# Patient Record
Sex: Female | Born: 1945 | Race: White | Hispanic: No | Smoking: Current every day smoker
Health system: Southern US, Community
[De-identification: ages and names within clinical notes are randomized; demographics above are authoritative.]

## PROBLEM LIST (undated history)

## (undated) DIAGNOSIS — I351 Nonrheumatic aortic (valve) insufficiency: Secondary | ICD-10-CM

## (undated) DIAGNOSIS — M79606 Pain in leg, unspecified: Secondary | ICD-10-CM

## (undated) DIAGNOSIS — F329 Major depressive disorder, single episode, unspecified: Secondary | ICD-10-CM

## (undated) DIAGNOSIS — I779 Disorder of arteries and arterioles, unspecified: Secondary | ICD-10-CM

## (undated) DIAGNOSIS — F909 Attention-deficit hyperactivity disorder, unspecified type: Secondary | ICD-10-CM

## (undated) DIAGNOSIS — J449 Chronic obstructive pulmonary disease, unspecified: Secondary | ICD-10-CM

## (undated) DIAGNOSIS — I739 Peripheral vascular disease, unspecified: Secondary | ICD-10-CM

## (undated) DIAGNOSIS — I340 Nonrheumatic mitral (valve) insufficiency: Secondary | ICD-10-CM

## (undated) DIAGNOSIS — I10 Essential (primary) hypertension: Secondary | ICD-10-CM

## (undated) DIAGNOSIS — E785 Hyperlipidemia, unspecified: Secondary | ICD-10-CM

## (undated) HISTORY — PX: RETINAL TEAR REPAIR CRYOTHERAPY: SHX5304

## (undated) HISTORY — DX: Pain in leg, unspecified: M79.606

## (undated) HISTORY — PX: TUBAL LIGATION: SHX77

## (undated) HISTORY — DX: Hyperlipidemia, unspecified: E78.5

---

## 1998-03-26 ENCOUNTER — Inpatient Hospital Stay (HOSPITAL_COMMUNITY): Admission: EM | Admit: 1998-03-26 | Discharge: 1998-03-28 | Payer: Self-pay | Admitting: Emergency Medicine

## 2000-07-22 ENCOUNTER — Emergency Department (HOSPITAL_COMMUNITY): Admission: EM | Admit: 2000-07-22 | Discharge: 2000-07-23 | Payer: Self-pay | Admitting: Emergency Medicine

## 2000-11-17 ENCOUNTER — Encounter: Admission: RE | Admit: 2000-11-17 | Discharge: 2000-11-17 | Payer: Self-pay | Admitting: Family Medicine

## 2000-11-17 ENCOUNTER — Encounter: Payer: Self-pay | Admitting: Family Medicine

## 2000-12-14 ENCOUNTER — Encounter: Admission: RE | Admit: 2000-12-14 | Discharge: 2001-01-09 | Payer: Self-pay | Admitting: Family Medicine

## 2001-07-18 ENCOUNTER — Encounter: Admission: RE | Admit: 2001-07-18 | Discharge: 2001-07-18 | Payer: Self-pay | Admitting: Family Medicine

## 2001-07-18 ENCOUNTER — Encounter: Payer: Self-pay | Admitting: Family Medicine

## 2002-04-27 ENCOUNTER — Other Ambulatory Visit: Admission: RE | Admit: 2002-04-27 | Discharge: 2002-04-27 | Payer: Self-pay | Admitting: Family Medicine

## 2003-08-22 ENCOUNTER — Other Ambulatory Visit: Admission: RE | Admit: 2003-08-22 | Discharge: 2003-08-22 | Payer: Self-pay | Admitting: Family Medicine

## 2003-12-06 ENCOUNTER — Encounter: Admission: RE | Admit: 2003-12-06 | Discharge: 2003-12-06 | Payer: Self-pay | Admitting: Gastroenterology

## 2003-12-06 ENCOUNTER — Encounter: Admission: RE | Admit: 2003-12-06 | Discharge: 2003-12-06 | Payer: Self-pay | Admitting: Family Medicine

## 2004-04-29 ENCOUNTER — Encounter: Admission: RE | Admit: 2004-04-29 | Discharge: 2004-04-29 | Payer: Self-pay | Admitting: Family Medicine

## 2005-02-25 ENCOUNTER — Ambulatory Visit: Payer: Self-pay | Admitting: Family Medicine

## 2005-03-02 ENCOUNTER — Encounter: Admission: RE | Admit: 2005-03-02 | Discharge: 2005-03-02 | Payer: Self-pay | Admitting: Family Medicine

## 2005-03-02 ENCOUNTER — Ambulatory Visit: Payer: Self-pay | Admitting: Family Medicine

## 2005-03-02 ENCOUNTER — Other Ambulatory Visit: Admission: RE | Admit: 2005-03-02 | Discharge: 2005-03-02 | Payer: Self-pay | Admitting: Family Medicine

## 2005-03-24 ENCOUNTER — Ambulatory Visit: Payer: Self-pay | Admitting: Family Medicine

## 2005-06-18 ENCOUNTER — Ambulatory Visit: Payer: Self-pay | Admitting: Family Medicine

## 2006-04-01 ENCOUNTER — Ambulatory Visit: Payer: Self-pay | Admitting: Family Medicine

## 2006-05-12 ENCOUNTER — Ambulatory Visit: Payer: Self-pay | Admitting: Family Medicine

## 2006-05-23 ENCOUNTER — Other Ambulatory Visit: Admission: RE | Admit: 2006-05-23 | Discharge: 2006-05-23 | Payer: Self-pay | Admitting: Family Medicine

## 2006-05-23 ENCOUNTER — Ambulatory Visit: Payer: Self-pay | Admitting: Family Medicine

## 2006-05-23 ENCOUNTER — Encounter: Payer: Self-pay | Admitting: Family Medicine

## 2006-11-22 ENCOUNTER — Ambulatory Visit: Payer: Self-pay | Admitting: Family Medicine

## 2007-03-10 ENCOUNTER — Ambulatory Visit (HOSPITAL_COMMUNITY): Payer: Self-pay | Admitting: Psychiatry

## 2007-04-20 ENCOUNTER — Ambulatory Visit (HOSPITAL_COMMUNITY): Payer: Self-pay | Admitting: Psychiatry

## 2007-05-18 ENCOUNTER — Ambulatory Visit (HOSPITAL_COMMUNITY): Payer: Self-pay | Admitting: Psychiatry

## 2007-06-06 ENCOUNTER — Ambulatory Visit (HOSPITAL_COMMUNITY): Payer: Self-pay | Admitting: Psychiatry

## 2007-06-12 ENCOUNTER — Ambulatory Visit (HOSPITAL_COMMUNITY): Payer: Self-pay | Admitting: Psychiatry

## 2007-06-13 DIAGNOSIS — F329 Major depressive disorder, single episode, unspecified: Secondary | ICD-10-CM

## 2007-06-13 DIAGNOSIS — K219 Gastro-esophageal reflux disease without esophagitis: Secondary | ICD-10-CM

## 2007-06-21 ENCOUNTER — Ambulatory Visit (HOSPITAL_COMMUNITY): Payer: Self-pay | Admitting: Psychiatry

## 2007-06-28 ENCOUNTER — Ambulatory Visit (HOSPITAL_COMMUNITY): Payer: Self-pay | Admitting: Psychiatry

## 2007-07-03 ENCOUNTER — Ambulatory Visit (HOSPITAL_COMMUNITY): Payer: Self-pay | Admitting: Psychiatry

## 2007-07-11 ENCOUNTER — Ambulatory Visit (HOSPITAL_COMMUNITY): Payer: Self-pay | Admitting: Psychiatry

## 2007-07-18 ENCOUNTER — Ambulatory Visit (HOSPITAL_COMMUNITY): Payer: Self-pay | Admitting: Psychiatry

## 2007-07-25 ENCOUNTER — Ambulatory Visit (HOSPITAL_COMMUNITY): Payer: Self-pay | Admitting: Psychiatry

## 2007-08-01 ENCOUNTER — Ambulatory Visit (HOSPITAL_COMMUNITY): Payer: Self-pay | Admitting: Psychiatry

## 2007-08-09 ENCOUNTER — Ambulatory Visit (HOSPITAL_COMMUNITY): Payer: Self-pay | Admitting: Psychiatry

## 2007-08-22 ENCOUNTER — Ambulatory Visit (HOSPITAL_COMMUNITY): Payer: Self-pay | Admitting: Psychiatry

## 2007-09-01 ENCOUNTER — Ambulatory Visit (HOSPITAL_COMMUNITY): Payer: Self-pay | Admitting: Psychiatry

## 2007-09-05 ENCOUNTER — Ambulatory Visit (HOSPITAL_COMMUNITY): Payer: Self-pay | Admitting: Psychiatry

## 2007-09-19 ENCOUNTER — Ambulatory Visit (HOSPITAL_COMMUNITY): Payer: Self-pay | Admitting: Psychiatry

## 2007-11-10 ENCOUNTER — Ambulatory Visit (HOSPITAL_COMMUNITY): Payer: Self-pay | Admitting: Psychiatry

## 2007-11-21 ENCOUNTER — Ambulatory Visit (HOSPITAL_COMMUNITY): Payer: Self-pay | Admitting: Psychiatry

## 2007-12-12 ENCOUNTER — Ambulatory Visit (HOSPITAL_COMMUNITY): Payer: Self-pay | Admitting: Psychology

## 2007-12-14 ENCOUNTER — Ambulatory Visit (HOSPITAL_COMMUNITY): Payer: Self-pay | Admitting: Psychiatry

## 2007-12-26 ENCOUNTER — Ambulatory Visit (HOSPITAL_COMMUNITY): Payer: Self-pay | Admitting: Psychiatry

## 2008-01-08 ENCOUNTER — Ambulatory Visit (HOSPITAL_COMMUNITY): Payer: Self-pay | Admitting: Psychiatry

## 2008-01-16 ENCOUNTER — Ambulatory Visit (HOSPITAL_COMMUNITY): Payer: Self-pay | Admitting: Psychology

## 2008-02-09 ENCOUNTER — Ambulatory Visit (HOSPITAL_COMMUNITY): Payer: Self-pay | Admitting: Psychiatry

## 2008-02-13 ENCOUNTER — Ambulatory Visit (HOSPITAL_COMMUNITY): Payer: Self-pay | Admitting: Psychology

## 2008-03-12 ENCOUNTER — Ambulatory Visit (HOSPITAL_COMMUNITY): Payer: Self-pay | Admitting: Psychology

## 2008-03-22 ENCOUNTER — Ambulatory Visit (HOSPITAL_COMMUNITY): Payer: Self-pay | Admitting: Psychiatry

## 2008-04-10 ENCOUNTER — Ambulatory Visit (HOSPITAL_COMMUNITY): Payer: Self-pay | Admitting: Psychology

## 2008-04-17 ENCOUNTER — Ambulatory Visit (HOSPITAL_COMMUNITY): Payer: Self-pay | Admitting: Psychiatry

## 2008-05-07 ENCOUNTER — Ambulatory Visit (HOSPITAL_COMMUNITY): Payer: Self-pay | Admitting: Psychology

## 2008-05-27 ENCOUNTER — Ambulatory Visit (HOSPITAL_COMMUNITY): Payer: Self-pay | Admitting: Psychology

## 2008-06-05 ENCOUNTER — Ambulatory Visit (HOSPITAL_COMMUNITY): Payer: Self-pay | Admitting: Psychiatry

## 2008-06-18 ENCOUNTER — Ambulatory Visit (HOSPITAL_COMMUNITY): Payer: Self-pay | Admitting: Psychology

## 2008-07-09 ENCOUNTER — Ambulatory Visit (HOSPITAL_COMMUNITY): Payer: Self-pay | Admitting: Psychology

## 2008-07-17 ENCOUNTER — Ambulatory Visit (HOSPITAL_COMMUNITY): Payer: Self-pay | Admitting: Psychiatry

## 2008-09-18 ENCOUNTER — Ambulatory Visit (HOSPITAL_COMMUNITY): Payer: Self-pay | Admitting: Psychiatry

## 2008-10-18 ENCOUNTER — Ambulatory Visit: Payer: Self-pay | Admitting: Family Medicine

## 2008-10-30 ENCOUNTER — Ambulatory Visit: Payer: Self-pay | Admitting: Family Medicine

## 2008-10-31 ENCOUNTER — Telehealth (INDEPENDENT_AMBULATORY_CARE_PROVIDER_SITE_OTHER): Payer: Self-pay | Admitting: *Deleted

## 2008-12-10 ENCOUNTER — Ambulatory Visit (HOSPITAL_COMMUNITY): Payer: Self-pay | Admitting: Psychiatry

## 2009-02-11 ENCOUNTER — Ambulatory Visit (HOSPITAL_COMMUNITY): Payer: Self-pay | Admitting: Psychiatry

## 2009-04-10 ENCOUNTER — Ambulatory Visit (HOSPITAL_COMMUNITY): Payer: Self-pay | Admitting: Psychiatry

## 2009-06-05 ENCOUNTER — Ambulatory Visit (HOSPITAL_COMMUNITY): Payer: Self-pay | Admitting: Psychiatry

## 2009-09-09 ENCOUNTER — Ambulatory Visit (HOSPITAL_COMMUNITY): Payer: Self-pay | Admitting: Psychiatry

## 2009-11-04 ENCOUNTER — Ambulatory Visit (HOSPITAL_COMMUNITY): Payer: Self-pay | Admitting: Psychiatry

## 2010-01-13 ENCOUNTER — Ambulatory Visit (HOSPITAL_COMMUNITY): Payer: Self-pay | Admitting: Psychiatry

## 2010-01-16 ENCOUNTER — Telehealth: Payer: Self-pay | Admitting: Family Medicine

## 2010-01-19 ENCOUNTER — Ambulatory Visit: Payer: Self-pay | Admitting: Family Medicine

## 2010-01-19 ENCOUNTER — Telehealth: Payer: Self-pay | Admitting: Family Medicine

## 2010-01-19 DIAGNOSIS — M5137 Other intervertebral disc degeneration, lumbosacral region: Secondary | ICD-10-CM

## 2010-01-19 DIAGNOSIS — F17200 Nicotine dependence, unspecified, uncomplicated: Secondary | ICD-10-CM

## 2010-01-21 LAB — CONVERTED CEMR LAB
Alkaline Phosphatase: 92 units/L (ref 39–117)
Basophils Absolute: 0 10*3/uL (ref 0.0–0.1)
Bilirubin, Direct: 0 mg/dL (ref 0.0–0.3)
CO2: 30 meq/L (ref 19–32)
Calcium: 9.1 mg/dL (ref 8.4–10.5)
Chloride: 85 meq/L — ABNORMAL LOW (ref 96–112)
Creatinine, Ser: 0.9 mg/dL (ref 0.4–1.2)
Eosinophils Absolute: 0.3 10*3/uL (ref 0.0–0.7)
Eosinophils Relative: 1.9 % (ref 0.0–5.0)
Folate: 18.1 ng/mL
GFR calc non Af Amer: 67.05 mL/min (ref >60)
Glucose, Bld: 104 mg/dL — ABNORMAL HIGH (ref 70–99)
HCT: 41.8 % (ref 36.0–46.0)
MCV: 86.3 fL (ref 78.0–100.0)
Monocytes Absolute: 0.7 10*3/uL (ref 0.1–1.0)
Neutrophils Relative %: 66.4 % (ref 43.0–77.0)
Platelets: 324 10*3/uL (ref 150.0–400.0)
Potassium: 3.3 meq/L — ABNORMAL LOW (ref 3.5–5.1)
RDW: 13.8 % (ref 11.5–14.6)
Saturation Ratios: 18.8 % — ABNORMAL LOW (ref 20.0–50.0)
Total Bilirubin: 0.2 mg/dL — ABNORMAL LOW (ref 0.3–1.2)
Total CHOL/HDL Ratio: 3
Total Protein: 7.6 g/dL (ref 6.0–8.3)
Transferrin: 278 mg/dL (ref 212.0–360.0)
Vitamin B-12: 497 pg/mL (ref 211–911)

## 2010-01-29 ENCOUNTER — Ambulatory Visit: Payer: Self-pay | Admitting: Family Medicine

## 2010-01-29 LAB — CONVERTED CEMR LAB
CO2: 28 meq/L (ref 19–32)
Calcium: 8.7 mg/dL (ref 8.4–10.5)
Potassium: 4 meq/L (ref 3.5–5.1)
Sodium: 143 meq/L (ref 135–145)

## 2010-02-10 ENCOUNTER — Ambulatory Visit: Payer: Self-pay | Admitting: Family Medicine

## 2010-02-26 ENCOUNTER — Ambulatory Visit: Payer: Self-pay | Admitting: Cardiovascular Disease

## 2010-02-26 DIAGNOSIS — M79609 Pain in unspecified limb: Secondary | ICD-10-CM | POA: Insufficient documentation

## 2010-03-10 ENCOUNTER — Ambulatory Visit: Payer: Self-pay

## 2010-03-10 ENCOUNTER — Ambulatory Visit: Payer: Self-pay | Admitting: Family Medicine

## 2010-03-10 ENCOUNTER — Encounter: Payer: Self-pay | Admitting: Cardiovascular Disease

## 2010-03-11 ENCOUNTER — Encounter: Payer: Self-pay | Admitting: Family Medicine

## 2010-03-17 ENCOUNTER — Telehealth: Payer: Self-pay | Admitting: Family Medicine

## 2010-03-18 ENCOUNTER — Telehealth: Payer: Self-pay | Admitting: Family Medicine

## 2010-03-18 HISTORY — PX: LAMINECTOMY: SHX219

## 2010-03-23 ENCOUNTER — Ambulatory Visit: Payer: Self-pay | Admitting: Cardiovascular Disease

## 2010-03-25 ENCOUNTER — Encounter (INDEPENDENT_AMBULATORY_CARE_PROVIDER_SITE_OTHER): Payer: Self-pay | Admitting: *Deleted

## 2010-03-26 ENCOUNTER — Encounter: Admission: RE | Admit: 2010-03-26 | Discharge: 2010-03-26 | Payer: Self-pay | Admitting: Family Medicine

## 2010-03-30 ENCOUNTER — Telehealth: Payer: Self-pay | Admitting: Family Medicine

## 2010-03-31 ENCOUNTER — Telehealth: Payer: Self-pay | Admitting: Family Medicine

## 2010-04-04 ENCOUNTER — Encounter: Payer: Self-pay | Admitting: Family Medicine

## 2010-04-30 ENCOUNTER — Encounter: Admission: RE | Admit: 2010-04-30 | Discharge: 2010-06-26 | Payer: Self-pay | Admitting: Neurosurgery

## 2010-05-15 ENCOUNTER — Ambulatory Visit (HOSPITAL_COMMUNITY): Payer: Self-pay | Admitting: Psychiatry

## 2010-08-14 ENCOUNTER — Ambulatory Visit (HOSPITAL_COMMUNITY): Payer: Self-pay | Admitting: Psychiatry

## 2010-10-14 ENCOUNTER — Encounter: Payer: Self-pay | Admitting: Emergency Medicine

## 2010-10-14 ENCOUNTER — Ambulatory Visit
Admission: RE | Admit: 2010-10-14 | Discharge: 2010-10-14 | Payer: Self-pay | Source: Home / Self Care | Admitting: Emergency Medicine

## 2010-10-14 DIAGNOSIS — E785 Hyperlipidemia, unspecified: Secondary | ICD-10-CM

## 2010-10-14 DIAGNOSIS — M25519 Pain in unspecified shoulder: Secondary | ICD-10-CM | POA: Insufficient documentation

## 2010-10-15 ENCOUNTER — Ambulatory Visit
Admission: RE | Admit: 2010-10-15 | Discharge: 2010-10-15 | Payer: Self-pay | Source: Home / Self Care | Attending: Family Medicine | Admitting: Family Medicine

## 2010-10-15 ENCOUNTER — Encounter: Payer: Self-pay | Admitting: Family Medicine

## 2010-10-15 LAB — CONVERTED CEMR LAB
ALT: 9 units/L (ref 0–35)
AST: 16 units/L (ref 0–37)
Alkaline Phosphatase: 87 units/L (ref 39–117)
Amylase: 51 units/L (ref 0–105)
Basophils Relative: 0 % (ref 0–1)
Eosinophils Absolute: 0.3 10*3/uL (ref 0.0–0.7)
Eosinophils Relative: 3 % (ref 0–5)
Glucose, Bld: 100 mg/dL — ABNORMAL HIGH (ref 70–99)
Hemoglobin: 12.9 g/dL (ref 12.0–15.0)
Lymphs Abs: 2.6 10*3/uL (ref 0.7–4.0)
MCHC: 33.5 g/dL (ref 30.0–36.0)
MCV: 86.1 fL (ref 78.0–100.0)
Neutrophils Relative %: 66 % (ref 43–77)
Platelets: 247 10*3/uL (ref 150–400)
Potassium: 3.5 meq/L (ref 3.5–5.3)
RBC: 4.47 M/uL (ref 3.87–5.11)
Sodium: 140 meq/L (ref 135–145)
Total Bilirubin: 0.2 mg/dL — ABNORMAL LOW (ref 0.3–1.2)
Total Protein: 6.3 g/dL (ref 6.0–8.3)

## 2010-10-16 ENCOUNTER — Telehealth: Payer: Self-pay | Admitting: Family Medicine

## 2010-10-16 ENCOUNTER — Encounter
Admission: RE | Admit: 2010-10-16 | Discharge: 2010-10-16 | Payer: Self-pay | Source: Home / Self Care | Attending: Family Medicine | Admitting: Family Medicine

## 2010-10-22 ENCOUNTER — Ambulatory Visit (HOSPITAL_COMMUNITY)
Admission: RE | Admit: 2010-10-22 | Discharge: 2010-10-22 | Payer: Self-pay | Source: Home / Self Care | Attending: Psychiatry | Admitting: Psychiatry

## 2010-11-17 NOTE — Progress Notes (Signed)
Summary: neuro appointment  Phone Note Call from Patient Call back at Home Phone [phone]   Summary of Call: patient is calling because her appointment for neuro is not until 04-23-10.  she is worried because her symptoms are increasing.  she would like to know if she can see someone sooner or if Dr Tawanna Cooler could possibly order a MRI?  Initial call taken by: Kern Reap CMA Duncan Dull),  March 18, 2010 12:52 PM  Follow-up for Phone Call        terri please call see if they can see her sooner Follow-up by: Roderick Pee MD,  March 19, 2010 9:00 AM  Additional Follow-up for Phone Call Additional follow up Details #1::        I called and spoke with Diane @ [facility] - she said the doctors do not have any earlier appts available for this pt (no work-ins...per nurse).  She recommended having Dr. Tawanna Cooler call and s/w Dr. Sandria Manly (Dr. Vickey Huger is out of town). Additional Follow-up by: Corky Mull,  March 19, 2010 11:13 AM    Additional Follow-up for Phone Call Additional follow up Details #2::    please call [facility] and have him seen in consultation in the neurology division.  There Follow-up by: Roderick Pee MD,  March 19, 2010 12:21 PM  Additional Follow-up for Phone Call Additional follow up Details #3:: Details for Additional Follow-up Action Taken: I called [facility] : [facility] and they have no appts until second week of July.  They cannot work-in pt, either. Additional Follow-up by: Corky Mull,  March 19, 2010 1:52 PM

## 2010-11-17 NOTE — Assessment & Plan Note (Signed)
Summary: PER CHECK OUT/SF   Visit Type:  Follow-up Primary Provider:  Roderick Pee MD  CC:  no complaints.  History of Present Illness: 65 yo female with history of GERD, Depression, tobacco abuse, lower back pain secondary to DDD here today for PV follow up. I saw her as a new PV consult last month. She told me at the first visit that she has recently began to have redness of her feet during the day and a warm feeling across both feet. Both feet are "sore" by the end of the day. There is no ambulatory calf pain. Her legs feel restless at the end of the day. Her legs feel heavy if she walks long distances. No ulcerations. Non-invasive studies were ordered at the last visit. There was no evidence of lower extremity arterial disease. She continues to have burning across her feet and leg heaviness. No calf cramping or pain.   Current Medications (verified): 1)  Aspirin 81 Mg Tbec (Aspirin) .... Take One Tablet By Mouth Daily 2)  Zoloft 100 Mg Tabs (Sertraline Hcl) .... 2 Tab By Mouth Once Daily 3)  Zantac 150 Maximum Strength 150 Mg Tabs (Ranitidine Hcl) .Marland Kitchen.. 1 Tab By Mouth Once Daily 4)  Prilosec 20 Mg Cpdr (Omeprazole) .Marland Kitchen.. 1 Tab By Mouth Once Daily 5)  Prevident 5000 Dry Mouth 1.1 % Pste (Sodium Fluoride) .... Use Ad Directed 6)  Simvastatin 80 Mg Tabs (Simvastatin) .... Take One Tab At Bedtime 7)  Triamterene-Hctz 37.5-25 Mg Tabs (Triamterene-Hctz) .Marland Kitchen.. 1 Tab M, W, F 8)  Vyvanse 50 Mg Caps (Lisdexamfetamine Dimesylate) .... Take One Tab in The Morning 9)  Lorazepam 0.5 Mg Tabs (Lorazepam) .... As Needed For Anxiety 10)  Ibuprofen 200 Mg Tabs (Ibuprofen) .... As Needed 11)  Fish Oil 1200 Mg Caps (Omega-3 Fatty Acids) .... Two Times A Day 12)  Cvs Vit D 5000 High-Potency 5000 Unit Caps (Cholecalciferol) .... Once Daily 13)  Daily Vitamins  Tabs (Multiple Vitamin) .... Once Daily 14)  Vicodin Es 7.5-750 Mg Tabs (Hydrocodone-Acetaminophen) .... 1/2 At Bedtime As Needed 15)  Chantix  Continuing Month Pak 1 Mg Tabs (Varenicline Tartrate) .... Uad  Allergies: 1)  ! Erythromycin  Past History:  Past Medical History: Leg pain-no evidence of PVD TOBACCO ABUSE (ICD-305.1) GERD (ICD-530.81) DEPRESSION (ICD-311) PREVENTIVE HEALTH CARE (ICD-V70.0) DISC DISEASE, LUMBAR (ICD-722.52) BRONCHITIS, ACUTE (ICD-466.0) URI (ICD-465.9) FAMILY HISTORY DIABETES 1ST DEGREE RELATIVE (ICD-V18.0)    Social History: Reviewed history from 02/26/2010 and no changes required. 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  The patient denies fatigue, malaise, fever, weight gain/loss, vision loss, decreased hearing, hoarseness, chest pain, palpitations, shortness of breath, prolonged cough, wheezing, sleep apnea, coughing up blood, abdominal pain, blood in stool, nausea, vomiting, diarrhea, heartburn, incontinence, blood in urine, muscle weakness, leg swelling, rash, skin lesions, headache, fainting, dizziness, depression, anxiety, enlarged lymph nodes, easy bruising or bleeding, and environmental allergies.    Vital Signs:  Patient profile:   66 year old female Height:      63.5 inches Weight:      132 pounds Pulse rate:   72 / minute Pulse rhythm:   regular BP sitting:   130 / 90  (left arm)  Vitals Entered By: Jacquelin Hawking, CMA (March 23, 2010 9:55 AM)  Physical Exam  General:  General: Well developed, well nourished, NAD Neuro: No focal deficits Musculoskeletal: Muscle strength 5/5 all ext Psychiatric: Mood and affect normal Lungs:Clear bilaterally, no wheezes, rhonci, crackles CV: RRR no murmurs, gallops rubs Abdomen: soft, NT, ND, BS present Extremities: No edema, pulses 2+.    Arterial Doppler  Procedure date:  03/10/2010  Findings:      Normal arterial flow both legs. ABI  1.0 bilaterally.   Impression & Recommendations:  Problem # 1:  LIMB PAIN (ICD-729.5) No evidence of PVD. Her foot pain is most likely neurological. She has spinal stenosis. She is being referred to neurology by Dr. Tawanna Cooler. No further PV workup.   Problem # 2:  TOBACCO ABUSE (ICD-305.1) She is on Chantix. She is trying to stop. Counseling provided.   Patient Instructions: 1)  Your physician recommends that you schedule a follow-up appointment as needed

## 2010-11-17 NOTE — Progress Notes (Signed)
Summary: REQ FOR APPT?  Phone Note Call from Patient   Caller: Patient  [phone]    Reason for Call: Talk to Doctor Summary of Call: Pt called to adv that she switched to a physician closer to her home but wants to return to Dr Tawanna Cooler..... Pt has been gone from practice for over 3 yrs and by policy would be considered a new pt..... Pt is exp some problems with anxiety and would like to be seen asap... Can you advise an okay to schedule pt for M30 appt asap (by schedule next M30 appt is April 21st but pt needs to be seen before then)??  Initial call taken by: Debbra Riding,  January 16, 2010 10:14 AM  Follow-up for Phone Call        30 minute appointment next week Follow-up by: Roderick Pee MD,  January 16, 2010 1:48 PM  Additional Follow-up for Phone Call Additional follow up Details #1::        Phone Call Completed----Pt was contacted and appt was scheduled for Monday, 12/19/2009 at 12:15pm.  Additional Follow-up by: Debbra Riding,  January 16, 2010 2:22 PM     Appended Document: REQ FOR APPT? Correction------- Appt was scheduled for pt on Monday, 01/19/2010 at 12:15pm.

## 2010-11-17 NOTE — Letter (Signed)
Summary: Previsit letter  [facility]  [address] [place], [place] [address]   Phone: [phone]  Fax: [phone]       03/25/2010 MRN: [id number]  [facility] [address] [place], [place]  [address]  Dear Ms. Kloeppel,  Welcome to the Gastroenterology Division at [facility].    You are scheduled to see a nurse for your pre-procedure visit on 04/06/2010 at 10:30AM on the 3rd floor at [facility], [address].  We ask that you try to arrive at our office 15 minutes prior to your appointment time to allow for check-in.  Your nurse visit will consist of discussing your medical and surgical history, your immediate family medical history, and your medications.    Please bring a complete list of all your medications or, if you prefer, bring the medication bottles and we will list them.  We will need to be aware of both prescribed and over the counter drugs.  We will need to know exact dosage information as well.  If you are on blood thinners (Coumadin, Plavix, Aggrenox, Ticlid, etc.) please call our office today/prior to your appointment, as we need to consult with your physician about holding your medication.   Please be prepared to read and sign documents such as consent forms, a financial agreement, and acknowledgement forms.  If necessary, and with your consent, a friend or relative is welcome to sit-in on the nurse visit with you.  Please bring your insurance card so that we may make a copy of it.  If your insurance requires a referral to see a specialist, please bring your referral form from your primary care physician.  No co-pay is required for this nurse visit.     If you cannot keep your appointment, please call [phone] to cancel or reschedule prior to your appointment date.  This allows [place] the opportunity to schedule an appointment for another patient in need of care.    Thank you for choosing [facility] for your  medical needs.  We appreciate the opportunity to care for you.  Please visit [place] at our website  to learn more about our practice.                     Sincerely.                                                                                                                   The Gastroenterology Division

## 2010-11-17 NOTE — Assessment & Plan Note (Signed)
Summary: NPV/ PVD with Claudication/jml   Visit Type:  new pt visit Primary Provider:  Roderick Pee MD  CC:  Bilateral foot pain and leg heaviness with walking.  History of Present Illness: 64 yo female with history of GERD, Depression, tobacco abuse, lower back pain secondary to DDD here today as a new PV patient. She tells me that she has recently began to have redness of her feet during the day and a warm feeling across both feet. Both feet are "sore" by the end of the day. There is no ambulatory calf pain. Her legs feel restless at the end of the day. Her legs feel heavy if she walks long distances. No ulcerations.   Current Medications (verified): 1)  Aspirin 81 Mg Tbec (Aspirin) .... Take One Tablet By Mouth Daily 2)  Zoloft 100 Mg Tabs (Sertraline Hcl) .... 2 Tab By Mouth Once Daily 3)  Zantac 150 Maximum Strength 150 Mg Tabs (Ranitidine Hcl) .Marland Kitchen.. 1 Tab By Mouth Once Daily 4)  Prilosec 20 Mg Cpdr (Omeprazole) .Marland Kitchen.. 1 Tab By Mouth Once Daily 5)  Prevident 5000 Dry Mouth 1.1 % Pste (Sodium Fluoride) .... Use Ad Directed 6)  Simvastatin 80 Mg Tabs (Simvastatin) .... Take One Tab At Bedtime 7)  Triamterene-Hctz 37.5-25 Mg Tabs (Triamterene-Hctz) .Marland Kitchen.. 1 Tab M, W, F 8)  Vyvanse 50 Mg Caps (Lisdexamfetamine Dimesylate) .... Take One Tab in The Morning 9)  Lorazepam 0.5 Mg Tabs (Lorazepam) .... As Needed For Anxiety 10)  Ibuprofen 200 Mg Tabs (Ibuprofen) .... As Needed 11)  Fish Oil 1200 Mg Caps (Omega-3 Fatty Acids) .... Two Times A Day 12)  Vitamin C 1000 Mg Tabs (Ascorbic Acid) .... Once Daily 13)  Cvs Vit D 5000 High-Potency 5000 Unit Caps (Cholecalciferol) .... Once Daily 14)  B-50  Tabs (Vitamins-Lipotropics) .... Once Daily 15)  Magnesium 250 Mg Tabs (Magnesium) .... Once Daily 16)  Daily Vitamins  Tabs (Multiple Vitamin) .... Once Daily 17)  Vicodin Es 7.5-750 Mg Tabs (Hydrocodone-Acetaminophen) .... 1/2 At Bedtime As Needed 18)  Chantix Continuing Month Pak 1 Mg Tabs  (Varenicline Tartrate) .... Uad  Allergies: 1)  ! Erythromycin  Past History:  Past Medical History: Reviewed history from 02/20/2010 and no changes required. 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) TOBACCO ABUSE (ICD-305.1) GERD (ICD-530.81) DEPRESSION (ICD-311) PREVENTIVE HEALTH CARE (ICD-V70.0) DISC DISEASE, LUMBAR (ICD-722.52) BRONCHITIS, ACUTE (ICD-466.0) URI (ICD-465.9) FAMILY HISTORY DIABETES 1ST DEGREE RELATIVE (ICD-V18.0)    Past Surgical History: Childbirth  x2 BTL Retina Tear  Family History: Family History of Parkinsons Family History Diabetes 1st degree relative Family History Hypertension Family History of Cardiovascular disorder  Mother-deceased, age 93 DM, CAD/CABG (maternal side with strong history of CAD) Father-deceased, Parkinsons, dementia 1 sister-alive, CAD  Social History: Occupation: A/R Systems developer, 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  The patient denies fatigue, malaise, fever, weight gain/loss, vision loss, decreased hearing, hoarseness, chest pain, palpitations, shortness of breath, prolonged cough, wheezing, sleep apnea, coughing up blood, abdominal pain, blood in stool, nausea, vomiting, diarrhea, heartburn, incontinence, blood in urine, muscle weakness, leg swelling, rash, skin lesions, headache, fainting, dizziness, depression, anxiety, enlarged lymph nodes, easy bruising or bleeding, and environmental allergies.         See HPI. Bilateral leg heaviness with ambulation, bilateral foot pain.  Vital Signs:  Patient profile:  65 year old female Height:      63.5 inches Weight:      130 pounds BMI:     22.75 Pulse rate:   78 / minute Pulse rhythm:   regular BP sitting:   140 / 80  (left arm) Cuff size:   large  Vitals Entered By: Danielle Rankin,  CMA (Feb 26, 2010 11:20 AM)  Physical Exam  General:  General: Well developed, well nourished, NAD HEENT: OP clear, mucus membranes moist SKIN: warm, dry Neuro: No focal deficits Musculoskeletal: Muscle strength 5/5 all ext Psychiatric: Mood and affect normal Neck: No JVD, no carotid bruits, no thyromegaly, no lymphadenopathy. Lungs:Clear bilaterally, no wheezes, rhonci, crackles CV: RRR no murmurs, gallops rubs Abdomen: soft, NT, ND, BS present Extremities: No edema, pulses 2+. bilateral PT, 1+ bilateral DP.     EKG  Procedure date:  02/26/2010  Findings:      NSR, rate 78 bpm.   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 Good pulses on examination. Will order lower extremity arterial dopplers to exclude PAD in this pt with h/o tobacco abuse.   Problem # 2:  TOBACCO ABUSE (ICD-305.1) Smoking cessation counseling provided. Pt wishes to stop smoking and is trying to stop.   Other Orders: Arterial Duplex Lower Extremity (Arterial Duplex Low)  Patient Instructions: 1)  Your physician recommends that you schedule a follow-up appointment in: 2-3 weeks 2)  Your physician has requested that you have a lower or upper extremity arterial duplex.  This test is an ultrasound of the arteries in the legs or arms.  It looks at arterial blood flow in the legs and arms.  Allow one hour for Lower and Upper Arterial scans. There are no restrictions or special instructions.

## 2010-11-17 NOTE — Progress Notes (Signed)
Summary: daughter's concerns  Phone Note Call from Patient   Caller: Daughter Call For: Roderick Pee MD Summary of Call: patient's daughter called concerned with the results of her mother's MRI and the response from the NS.  She would like to speak with dr Lewis Grivas her cell number is [phone] or work [phone].  She also stated that her mom is having some incontinence.  Follow-up for Phone Call        I explained to Ms Alicia Aguilar that Dr Tawanna Cooler is with  patients.  I also called the patient and she has agreed to a second opinion.  referral sent.  Follow-up by: Kern Reap CMA Duncan Dull),  March 31, 2010 9:04 AM    s

## 2010-11-17 NOTE — Progress Notes (Signed)
Summary: 2nd opinion  Phone Note Call from Patient   Caller: Patient Call For: Roderick Pee MD Summary of Call: Asking to speak to Fleet Contras about a second opinion regarding with a different neurosurgeon.  Her daughter is recommending this. [phone] Initial call taken by: Lynann Beaver CMA,  March 30, 2010 4:16 PM  Follow-up for Phone Call        Fleet Contras.......... please call Alicia Aguilar and have her call the neurosurgeon, who she's been seeing and have them set up a second opinion Follow-up by: Roderick Pee MD,  March 30, 2010 6:18 PM  Additional Follow-up for Phone Call Additional follow up Details #1::        patient is aware Additional Follow-up by: Kern Reap CMA Duncan Dull),  March 31, 2010 8:51 AM

## 2010-11-17 NOTE — Progress Notes (Signed)
Summary: colon  Phone Note Call from Patient   Summary of Call: Is scheduled for colon at [facility] Fri that she does not want to keep.  She will cancel it and talk to you at [facility] about getting colon setup with [facility].   Initial call taken by: Rudy Jew, RN,  January 19, 2010 3:46 PM  Follow-up for Phone Call        Provider Notified Follow-up by: Roderick Pee MD,  January 19, 2010 4:15 PM

## 2010-11-17 NOTE — Progress Notes (Signed)
Summary: referral  Phone Note Call from Patient Call back at Home Phone [phone]   Summary of Call: Was to have appointment made for my spinal stenosis, foot, numb toe left foot.  Initial call taken by: Rudy Jew, RN,  Mar 17, 2010 3:52 PM  Follow-up for Phone Call        please check on  referral to the Dr. Danielle Dess evaluation of probable spinal stenosis ??????????? Follow-up by: Roderick Pee MD,  March 18, 2010 7:58 AM  Additional Follow-up for Phone Call Additional follow up Details #1::        Appt Scheduled, Left message for pt. Additional Follow-up by: Corky Mull,  March 18, 2010 11:56 AM

## 2010-11-17 NOTE — Assessment & Plan Note (Signed)
Summary: PT RE-EST // RS   Vital Signs:  Patient profile:   65 year old female Height:      63.5 inches Weight:      131 pounds BMI:     22.92 Temp:     99.0 degrees F oral BP sitting:   140 / 88  (left arm) Cuff size:   regular  Vitals Entered By: Kern Reap CMA Duncan Dull) (January 19, 2010 12:31 PM) CC: re-establish care, back pain Is Patient Diabetic? No   CC:  re-establish care and back pain.  History of Present Illness: Alicia Aguilar is a 65 year old female, smoker, who returns as a new patient.  She's had a history of low back pain x 2 months with no history of trauma.  She describes it as right and left lumbar back pain.  The pain is constant and dull on a scale of one to 10 and 6.  It does not radiate.  It is decreased by ice in increased by bending.  She said back pain in the past, but never this prolonged nor severe.  She denies any neurologic problems.  No GI, nor GU symptoms.  She continues to smoke a pack of cigarettes a day.  When we placed her on the chantix program years ago.  It helped will restart it  she also goes on about multiple other problems, including numbness in her feet and redness in her feet, constipation, bloating, etc., etc., etc.  Advised her to set up a 30 minute appointment sometime in the next two to 3 weeks to address the other issues  Allergies: 1)  ! Erythromycin PMH-FH-SH reviewed-no changes except otherwise noted  Social History: Reviewed history from 06/13/2007 and no changes required. Occupation: A/R Systems developer Divorced Current Smoker Alcohol use-yes Regular exercise-no  Review of Systems      See HPI  Physical Exam  General:  Well-developed,well-nourished,in no acute distress; alert,appropriate and cooperative throughout examination Msk:  No deformity or scoliosis noted of thoracic or lumbar spine.   Pulses:  pulses are palpable to about a 4 Extremities:  No clubbing, cyanosis, edema, or deformity noted with normal full range of motion  of all joints.   Neurologic:  No cranial nerve deficits noted. Station and gait are normal. Plantar reflexes are down-going bilaterally. DTRs are symmetrical throughout. Sensory, motor and coordinative functions appear intact.   Impression & Recommendations:  Problem # 1:  TOBACCO ABUSE (ICD-305.1) Assessment New  Orders: Venipuncture (09811) TLB-Lipid Panel (80061-LIPID) TLB-BMP (Basic Metabolic Panel-BMET) (80048-METABOL) TLB-CBC Platelet - w/Differential (85025-CBCD) TLB-Hepatic/Liver Function Pnl (80076-HEPATIC) TLB-TSH (Thyroid Stimulating Hormone) (84443-TSH) TLB-B12 + Folate Pnl (91478_29562-Z30/QMV) TLB-IBC Pnl (Iron/FE;Transferrin) (83550-IBC) Tobacco use cessation intermediate 3-10 minutes (78469)  Her updated medication list for this problem includes:    Chantix Continuing Month Pak 1 Mg Tabs (Varenicline tartrate) ..... Uad  Problem # 2:  DISC DISEASE, LUMBAR (ICD-722.52) Assessment: New  Orders: Venipuncture (62952) TLB-Lipid Panel (80061-LIPID) TLB-BMP (Basic Metabolic Panel-BMET) (80048-METABOL) TLB-CBC Platelet - w/Differential (85025-CBCD) TLB-Hepatic/Liver Function Pnl (80076-HEPATIC) TLB-TSH (Thyroid Stimulating Hormone) (84443-TSH) TLB-B12 + Folate Pnl (84132_44010-U72/ZDG) TLB-IBC Pnl (Iron/FE;Transferrin) (83550-IBC) Physical Therapy Referral (PT) T-Lumbar Spine w/Flex & Ext 4 Views (64403KV) Tobacco use cessation intermediate 3-10 minutes (42595)  Complete Medication List: 1)  Adult Aspirin Low Strength 81 Mg Tbdp (Aspirin) 2)  Zoloft 100 Mg Tabs (Sertraline hcl) .... 2 tab by mouth once daily 3)  Zantac 150 Maximum Strength 150 Mg Tabs (Ranitidine hcl) .Marland Kitchen.. 1 tab by mouth once daily 4)  Prilosec 20  Mg Cpdr (Omeprazole) .Marland Kitchen.. 1 tab by mouth once daily 5)  Prevident 5000 Dry Mouth 1.1 % Pste (Sodium fluoride) .... Use ad directed 6)  Simvastatin 80 Mg Tabs (Simvastatin) .... Take one tab at bedtime 7)  Triamterene-hctz 37.5-25 Mg Tabs  (Triamterene-hctz) .... Take one tab once daily 8)  Vyvanse 50 Mg Caps (Lisdexamfetamine dimesylate) .... Take one tab in the morning 9)  Lorazepam 0.5 Mg Tabs (Lorazepam) .... As needed for anxiety 10)  Ibuprofen 200 Mg Tabs (Ibuprofen) .... As needed 11)  Fish Oil 1200 Mg Caps (Omega-3 fatty acids) .... Two times a day 12)  Vitamin C 1000 Mg Tabs (Ascorbic acid) .... Once daily 13)  Cvs Vit D 5000 High-potency 5000 Unit Caps (Cholecalciferol) .... Once daily 14)  B-50 Tabs (Vitamins-lipotropics) .... Once daily 15)  Magnesium 250 Mg Tabs (Magnesium) .... Once daily 16)  Daily Vitamins Tabs (Multiple vitamin) .... Once daily 17)  Vicodin Es 7.5-750 Mg Tabs (Hydrocodone-acetaminophen) .... 1/2 at bedtime as needed 18)  Chantix Continuing Month Pak 1 Mg Tabs (Varenicline tartrate) .... Uad  Patient Instructions: 1)  go to the main office for your lumbar spine films. 2)  Begin Motrin 600 mg twice a day with food, and we will get to set up for physical therapy ASAP 3)  You can take a half of a Vicodin nightly as needed for severe nighttime pain. 4)  Set time in the next 3 to 6 weeks for a complete physical exam. 5)  Begin the chantix program one half of the blue tablets daily x 1 week then one half tablet twice a day.  After one week on the chantix stop smoking completely Prescriptions: CHANTIX CONTINUING MONTH PAK 1 MG TABS (VARENICLINE TARTRATE) UAD  #1 x 2   Entered and Authorized by:   Roderick Pee MD   Signed by:   Roderick Pee MD on 01/19/2010   Method used:   Print then Give to Patient   RxID:   [id number] VICODIN ES 7.5-750 MG TABS (HYDROCODONE-ACETAMINOPHEN) 1/2 at bedtime as needed  #30 x 1   Entered and Authorized by:   Roderick Pee MD   Signed by:   Roderick Pee MD on 01/19/2010   Method used:   Print then Give to Patient   RxID:   [id number]

## 2010-11-17 NOTE — Assessment & Plan Note (Signed)
Summary: 1 month rov/njr   Vital Signs:  Patient profile:   65 year old female Weight:      133 pounds Temp:     99.4 degrees F oral BP sitting:   130 / 84  (left arm) Cuff size:   regular  Vitals Entered By: Kern Reap CMA Duncan Dull) (Mar 10, 2010 12:09 PM) CC: follow-up visit   Primary Care Provider:  Roderick Pee MD  CC:  follow-up visit.  History of Present Illness: Alicia Aguilar is a 65 year old female, who comes in today for reevaluation of tobacco abuse, and leg pain.  She is done very, very well with the chantix program.  She now only smokes two to 3 cigarettes per day.  Goal by this Friday......... is to stop completely.  she's been to 10, therapy sessions for her back and her back feels much better.  She continues to have pain in her legs with walking.  Her ABI is one in the impression from her arteriogram for is no evidence of significant arterial disease.  Since her lower extremity blood flow is normal and she still has discomfort in her legs when she walks the next most likely diagnosis would be spinal stenosis.  Therefore, we will get her set up for a neurologic consultation  Allergies: 1)  ! Erythromycin  Review of Systems      See HPI  Physical Exam  General:  Well-developed,well-nourished,in no acute distress; alert,appropriate and cooperative throughout examination Pulses:  femorals 2+ bilaterally popliteal dorsalis pedis, posterior tibials 1+   Impression & Recommendations:  Problem # 1:  LIMB PAIN (ICD-729.5) Assessment Unchanged  Orders: Neurology Referral (Neuro) Tobacco use cessation intermediate 3-10 minutes (82956)  Problem # 2:  TOBACCO ABUSE (ICD-305.1) Assessment: Improved  Her updated medication list for this problem includes:    Chantix Continuing Month Pak 1 Mg Tabs (Varenicline tartrate) ..... Uad  Orders: Tobacco use cessation intermediate 3-10 minutes (99406)  Complete Medication List: 1)  Aspirin 81 Mg Tbec (Aspirin) .... Take  one tablet by mouth daily 2)  Zoloft 100 Mg Tabs (Sertraline hcl) .... 2 tab by mouth once daily 3)  Zantac 150 Maximum Strength 150 Mg Tabs (Ranitidine hcl) .Marland Kitchen.. 1 tab by mouth once daily 4)  Prilosec 20 Mg Cpdr (Omeprazole) .Marland Kitchen.. 1 tab by mouth once daily 5)  Prevident 5000 Dry Mouth 1.1 % Pste (Sodium fluoride) .... Use ad directed 6)  Simvastatin 80 Mg Tabs (Simvastatin) .... Take one tab at bedtime 7)  Triamterene-hctz 37.5-25 Mg Tabs (Triamterene-hctz) .Marland Kitchen.. 1 tab m, w, f 8)  Vyvanse 50 Mg Caps (Lisdexamfetamine dimesylate) .... Take one tab in the morning 9)  Lorazepam 0.5 Mg Tabs (Lorazepam) .... As needed for anxiety 10)  Ibuprofen 200 Mg Tabs (Ibuprofen) .... As needed 11)  Fish Oil 1200 Mg Caps (Omega-3 fatty acids) .... Two times a day 12)  Vitamin C 1000 Mg Tabs (Ascorbic acid) .... Once daily 13)  Cvs Vit D 5000 High-potency 5000 Unit Caps (Cholecalciferol) .... Once daily 14)  B-50 Tabs (Vitamins-lipotropics) .... Once daily 15)  Magnesium 250 Mg Tabs (Magnesium) .... Once daily 16)  Daily Vitamins Tabs (Multiple vitamin) .... Once daily 17)  Vicodin Es 7.5-750 Mg Tabs (Hydrocodone-acetaminophen) .... 1/2 at bedtime as needed 18)  Chantix Continuing Month Pak 1 Mg Tabs (Varenicline tartrate) .... Uad  Patient Instructions: 1)  continue to taper down on the cigarettes as your currently doing.  Continue the Entex twice daily. 2)  We will get u  set up for a neurologic consult for further evaluation

## 2010-11-17 NOTE — Assessment & Plan Note (Signed)
Summary: 3 week fup//ccm   Vital Signs:  Patient profile:   65 year old female Height:      63.5 inches Weight:      131 pounds Temp:     98.8 degrees F oral BP sitting:   110 / 80  (left arm) Cuff size:   regular  Vitals Entered By: Kern Reap CMA Duncan Dull) (February 10, 2010 11:16 AM) CC: follow-up visit Is Patient Diabetic? No   CC:  follow-up visit.  History of Present Illness: Alicia Aguilar is a 65 year old female, smoker, but she is on a smoking cessation program with chantix has decreased her cigarette consumption to one or two per day and comes in today for general physical examination  She takes Zoloft 100 mg nightly for depression.  Doing well.  She has reflux esophagitis, for which he takes OTC Prilosec in the morning and one Zantac prior to her evening meal.  She takes simvastatin 80 mg nightly for hyperlipidemia.  She takes Maxzide one daily for fluid retention.  She takes vyvanse 50 mg daily for ADD.  She takes lorazepam .5 mg p.r.n. for anxiety.  She is on a smoking cessation program.  As noted above.  She does not get routine eye exams, nor annual mammography.  Recommended above.  She is in the process of having her teeth extracted.  She does do BSE monthly.  She's never had a colonoscopy.  Tetanus booster 1998 Pneumovax 2006 seasonal flu 2010.  She had a Pap smear in January in [place].  She's having more difficulty with her legs with walking.  Her feet are very red  Allergies: 1)  ! Erythromycin  Past History:  Past medical, surgical, family and social histories (including risk factors) reviewed, and no changes noted (except as noted below).  Past Medical History: Reviewed history from 06/13/2007 and no changes required. TAB Abuse PMS PE ASYM Hematuria Depression GERD  Past Surgical History: Reviewed history from 06/13/2007 and no changes required. CB x2 BTL Retina Tear  Family History: Reviewed history from 06/13/2007 and no changes  required. Family History of Parkinsons Family History Diabetes 1st degree relative Family History Hypertension Family History of Cardiovascular disorder  Social History: Reviewed history from 06/13/2007 and no changes required. Occupation: A/R Systems developer Divorced Current Smoker Alcohol use-yes Regular exercise-no  Review of Systems      See HPI  Physical Exam  General:  Well-developed,well-nourished,in no acute distress; alert,appropriate and cooperative throughout examination Head:  Normocephalic and atraumatic without obvious abnormalities. No apparent alopecia or balding. Eyes:  No corneal or conjunctival inflammation noted. EOMI. Perrla. Funduscopic exam benign, without hemorrhages, exudates or papilledema. Vision grossly normal. Ears:  External ear exam shows no significant lesions or deformities.  Otoscopic examination reveals clear canals, tympanic membranes are intact bilaterally without bulging, retraction, inflammation or discharge. Hearing is grossly normal bilaterally. Nose:  External nasal examination shows no deformity or inflammation. Nasal mucosa are pink and moist without lesions or exudates. Mouth:  Oral mucosa and oropharynx without lesions or exudates.  Teeth in good repair. Neck:  No deformities, masses, or tenderness noted. Chest Wall:  No deformities, masses, or tenderness noted. Breasts:  No mass, nodules, thickening, tenderness, bulging, retraction, inflamation, nipple discharge or skin changes noted.   Lungs:  symmetrical but decreased breath sounds consistent with chronic tobacco abuse Heart:  Normal rate and regular rhythm. S1 and S2 normal without gallop, murmur, click, rub or other extra sounds. Abdomen:  Bowel sounds positive,abdomen soft and non-tender without masses, organomegaly  or hernias noted. Msk:  No deformity or scoliosis noted of thoracic or lumbar spine.   Pulses:  no carotid bruits.  Femoral pulses 3+ popliteals 1+ dorsalis pedis, posterior  tubes, right and left, not palpable Extremities:  the feet, already, and red colored.  No ulcerations.  Poor capillary filling Neurologic:  No cranial nerve deficits noted. Station and gait are normal. Plantar reflexes are down-going bilaterally. DTRs are symmetrical throughout. Sensory, motor and coordinative functions appear intact. Skin:  Intact without suspicious lesions or rashes Cervical Nodes:  No lymphadenopathy noted Axillary Nodes:  No palpable lymphadenopathy Inguinal Nodes:  No significant adenopathy Psych:  Cognition and judgment appear intact. Alert and cooperative with normal attention span and concentration. No apparent delusions, illusions, hallucinations   Impression & Recommendations:  Problem # 1:  TOBACCO ABUSE (ICD-305.1) Assessment Improved  Her updated medication list for this problem includes:    Chantix Continuing Month Pak 1 Mg Tabs (Varenicline tartrate) ..... Uad  Orders: Tobacco use cessation intermediate 3-10 minutes (99406) EKG w/ Interpretation (93000) T-2 View CXR (71020TC)  Problem # 2:  FAMILY HISTORY DIABETES 1ST DEGREE RELATIVE (ICD-V18.0) Assessment: Unchanged  Problem # 3:  DEPRESSION (ICD-311) Assessment: Improved  Her updated medication list for this problem includes:    Zoloft 100 Mg Tabs (Sertraline hcl) .Marland Kitchen... 2 tab by mouth once daily    Lorazepam 0.5 Mg Tabs (Lorazepam) .Marland Kitchen... 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 2 tab by mouth once daily 3)  Zantac 150 Maximum Strength 150 Mg Tabs (Ranitidine hcl) .Marland Kitchen.. 1 tab by mouth once daily 4)   Prilosec 20 Mg Cpdr (Omeprazole) .Marland Kitchen.. 1 tab by mouth once daily 5)  Prevident 5000 Dry Mouth 1.1 % Pste (Sodium fluoride) .... Use ad directed 6)  Simvastatin 80 Mg Tabs (Simvastatin) .... Take one tab at bedtime 7)  Triamterene-hctz 37.5-25 Mg Tabs (Triamterene-hctz) .... Take one tab once daily 8)  Vyvanse 50 Mg Caps (Lisdexamfetamine dimesylate) .... Take one tab in the morning 9)  Lorazepam 0.5 Mg Tabs (Lorazepam) .... As needed for anxiety 10)  Ibuprofen 200 Mg Tabs (Ibuprofen) .... As needed 11)  Fish Oil 1200 Mg Caps (Omega-3 fatty acids) .... Two times a day 12)  Vitamin C 1000 Mg Tabs (Ascorbic acid) .... Once daily 13)  Cvs Vit D 5000 High-potency 5000 Unit Caps (Cholecalciferol) .... Once daily 14)  B-50 Tabs (Vitamins-lipotropics) .... Once daily 15)  Magnesium 250 Mg Tabs (Magnesium) .... Once daily 16)  Daily Vitamins Tabs (Multiple vitamin) .... Once daily 17)  Vicodin Es 7.5-750 Mg Tabs (Hydrocodone-acetaminophen) .... 1/2 at bedtime as needed 18)  Chantix Continuing Month Pak 1 Mg Tabs (Varenicline tartrate) .... Uad  Other Orders: Tdap => 52yrs IM (30160) Admin 1st Vaccine (10932) Gastroenterology Referral (GI)  Patient Instructions: 1)  continue your current medications. 2)  Chest x-ray at the main office. 3)  We will such up the evaluation for your circulation of your legs. 4)  Schedule a colonoscopy/sigmoidoscopy to help detect colon cancer. 5)  Take an Aspirin every day. 6)  Schedule your mammogram. 7)  Please schedule a follow-up appointment in 1 month.   Immunizations Administered:  Tetanus Vaccine:    Vaccine Type: Tdap    Site: right deltoid    Mfr: GlaxoSmithKline    Dose: 0.5 ml    Route: IM    Given by: Kern Reap CMA (AAMA)    Exp. Date: 01/10/2012    Lot #: [id number]    Physician counseled: yes

## 2010-11-17 NOTE — Miscellaneous (Signed)
Summary: Physical Therapy Discharge [facility]  Physical Therapy Discharge Summary/SMOC   Imported By: Maryln Gottron 03/17/2010 10:09:43  _____________________________________________________________________  External Attachment:    Type:   Image     Comment:   External Document

## 2010-11-19 NOTE — Assessment & Plan Note (Signed)
Summary: PAIN IN SHOULDER BLADE/NH   Vital Signs:  Patient Profile:   65 Years Old Female CC:      nausea and right shoulder blade pain x this AM Height:     63.5 inches Weight:      130 pounds O2 Sat:      98 % O2 treatment:    Room Air Temp:     98.2 degrees F oral Pulse rate:   84 / minute Resp:     16 per minute BP sitting:   167 / 103  (left arm) Cuff size:   regular  Vitals Entered By: Lajean Saver RN (October 14, 2010 4:02 PM)                  Updated Prior Medication List: ASPIRIN 81 MG TBEC (ASPIRIN) Take one tablet by mouth daily ZOLOFT 100 MG TABS (SERTRALINE HCL) 2 tab by mouth once daily ZANTAC 150 MAXIMUM STRENGTH 150 MG TABS (RANITIDINE HCL) 1 tab by mouth once daily PRILOSEC 20 MG CPDR (OMEPRAZOLE) 1 tab by mouth once daily PREVIDENT 5000 DRY MOUTH 1.1 % PSTE (SODIUM FLUORIDE) use ad directed SIMVASTATIN 80 MG TABS (SIMVASTATIN) take one tab at bedtime TRIAMTERENE-HCTZ 37.5-25 MG TABS (TRIAMTERENE-HCTZ) 1 tab m, w, f VYVANSE 50 MG CAPS (LISDEXAMFETAMINE DIMESYLATE) take one tab in the morning LORAZEPAM 0.5 MG TABS (LORAZEPAM) as needed for anxiety IBUPROFEN 200 MG TABS (IBUPROFEN) as needed FISH OIL 1200 MG CAPS (OMEGA-3 FATTY ACIDS) two times a day CVS VIT D 5000 HIGH-POTENCY 5000 UNIT CAPS (CHOLECALCIFEROL) once daily DAILY VITAMINS  TABS (MULTIPLE VITAMIN) once daily VICODIN ES 7.5-750 MG TABS (HYDROCODONE-ACETAMINOPHEN) 1/2 at bedtime as needed  Current Allergies (reviewed today): ! ERYTHROMYCINHistory of Present Illness History from: patient Chief Complaint: nausea and right shoulder blade pain x this AM History of Present Illness: Pain in R shoulder blade area since 11am this morning.  Constant soreness.  No CP, SOB.  But does have mild nausea.  She is concerned with her gall bladder.  She has been under stress recently.  Pain with shoulder movement but she states that the pain wraps around the front side too.  She has an initial establish PCP appt  tomorrow morning.  REVIEW OF SYSTEMS Constitutional Symptoms      Denies fever, chills, night sweats, weight loss, weight gain, and fatigue.  Eyes       Denies change in vision, eye pain, eye discharge, glasses, contact lenses, and eye surgery. Ear/Nose/Throat/Mouth       Denies hearing loss/aids, change in hearing, ear pain, ear discharge, dizziness, frequent runny nose, frequent nose bleeds, sinus problems, sore throat, hoarseness, and tooth pain or bleeding.  Respiratory       Denies dry cough, productive cough, wheezing, shortness of breath, asthma, bronchitis, and emphysema/COPD.  Cardiovascular       Denies murmurs, chest pain, and tires easily with exhertion.    Gastrointestinal       Complains of nausea/vomiting.      Denies stomach pain, diarrhea, constipation, blood in bowel movements, and indigestion.      Comments: nausea only Genitourniary       Denies painful urination, kidney stones, and loss of urinary control. Neurological       Denies paralysis, seizures, and fainting/blackouts. Musculoskeletal       Complains of muscle pain, joint pain, and joint stiffness.      Denies decreased range of motion, redness, swelling, muscle weakness, and gout.  Skin  Denies bruising, unusual mles/lumps or sores, and hair/skin or nail changes.  Psych       Denies mood changes, temper/anger issues, anxiety/stress, speech problems, depression, and sleep problems. Other Comments: Patient c/o right shoulder blade pain x this AM that radiates to her right breast. 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, 2 chldren Current Smoker-1/2 ppd for 20 years Alcohol use-yes, social No ilicit drug use Regular exercise-no Physical Exam General appearance: well developed, well nourished, no acute distress Ears: normal, no lesions or deformities Nasal: mucosa pink, nonedematous, no septal deviation, turbinates normal Oral/Pharynx: tongue normal, posterior pharynx without erythema or exudate Chest/Lungs: no rales, wheezes, or rhonchi bilateral, breath sounds equal without effort Heart: regular rate and  rhythm, no murmur Skin: no rashes or lesions seen MSE: oriented to time, place, and person R shoulder: TTP along area of levator scapula and with resisted scapular abduction.  TTP along approx the R T5 or T6 dermatome.  No bony pain scapula, clavicle, coracoid, acromion.  Distal NV status intact. Assessment New Problems: SHOULDER PAIN, RIGHT (ICD-719.41) NAUSEA (ICD-787.02) HYPERLIPIDEMIA (ICD-272.4)   Patient Education: Patient and/or caregiver instructed in the following: rest, fluids.  Plan New Orders: New Patient Level III [99203] EKG w/ Interpretation [93000] T-CBC w/Diff [[id number]] T-Comprehensive Metabolic Panel [80053-22900] T-Amylase [82150-23210] T-Lipase [83690-23215] T-TSH [[id number]] Planning Comments:   EKG is abnormal showing flipped T-waves in AVR, V1, V2, V4, V5.  I looked back and found 2 old EKGs which had the same changes.  Therefore I don't believe this is cardiac in origin.  I gave her a copy of  today's EKG to carry in her purse so that future physicians will know that this is baseline.  She can follow up with cardiology if  her PCP would like to.  She has a appt to establish with a new PCP tomorrow.  I have drawn blood today too (CBC, CMP, Amylase, Lipase, TSH) to show any potential liver or gall bladder problem.  She may have a periscapular strain, but more concerning is possible shingles.  I have not started her on any medications today since this has been going on for only a few hours.  I have educated her on shingles and have told her to be on the watch for shingles.  If the dermatomal distribution of pain continues until tomorrow, then it would be quite reasonable to start on Prednisone + antiviral treatment.  If any further L sided CP, SOB, diaphoresis, or any worsening of symptoms, pt to go directly to the ER for further cardiac evaluation.  Encouraged stress relief as well since that could have been a trigger for shingles.   The patient and/or caregiver has been counseled thoroughly with regard to medications prescribed including dosage, schedule, interactions, rationale for use, and possible side effects and they  verbalize understanding.  Diagnoses and expected course of recovery discussed and will return if not improved as expected or if the condition worsens. Patient and/or caregiver verbalized understanding.   Orders Added: 1)  New Patient Level III [99203] 2)  EKG w/ Interpretation [93000] 3)  T-CBC w/Diff [[id number]] 4)  T-Comprehensive Metabolic Panel [[id number]] 5)  T-Amylase [82150-23210] 6)  T-Lipase [83690-23215] 7)  T-TSH [[id number]]

## 2010-11-19 NOTE — Assessment & Plan Note (Signed)
Summary: new to est, transfer from [employer]   Vital Signs:  Patient profile:   65 year old female Height:      63.5 inches Weight:      129 pounds Pulse rate:   71 / minute BP sitting:   137 / 90  (right arm) Cuff size:   regular  Vitals Entered By: Avon Gully CMA, Duncan Dull) (October 15, 2010 11:38 AM) CC: Npe-set care, back pain   Primary Care Provider:  Roderick Pee MD  CC:  Npe-set care and back pain.  History of Present Illness: Pain under her shoulder blade on the right It is sharp and constant. Radiating to mid-back adn now mid upper front of chest over the sternum.  It is not exacerbating by eating or deep breaths. thought she hasn't eaten today. Susie Cassette some old hydrocodone from her back surgery.  Now moved to middle and in her chest.  No nausea today, some yesterday.  No chang in her bowels.  No blood in teh stool.   Does have hx of GERD. Has been taking alot of IBU lately for her back.  She does take her PPI regularly.   Hx of abnormal EKG. Abnormalities seen yesterday were consistant with past EKGS.  Pt says has had several stress tests that were normal. Had these done at [facility].   Habits & Providers  Alcohol-Tobacco-Diet     Alcohol drinks/day: 0     Tobacco Status: current     Cigarette Packs/Day: 0.5  Exercise-Depression-Behavior     Does Patient Exercise: no     STD Risk: never     Drug Use: never     Seat Belt Use: always  Current Medications (verified): 1)  Aspirin 81 Mg Tbec (Aspirin) .... Take One Tablet By Mouth Daily 2)  Zoloft 100 Mg Tabs (Sertraline Hcl) .... 2 Tab By Mouth Once Daily 3)  Zantac 150 Maximum Strength 150 Mg Tabs (Ranitidine Hcl) .Marland Kitchen.. 1 Tab By Mouth Once Daily 4)  Prilosec 20 Mg Cpdr (Omeprazole) .Marland Kitchen.. 1 Tab By Mouth Once Daily 5)  Prevident 5000 Dry Mouth 1.1 % Pste (Sodium Fluoride) .... Use Ad Directed 6)  Simvastatin 80 Mg Tabs (Simvastatin) .... Take One Tab At Bedtime 7)  Triamterene-Hctz 37.5-25 Mg Tabs  (Triamterene-Hctz) .Marland Kitchen.. 1 Tab M, W, F 8)  Vyvanse 50 Mg Caps (Lisdexamfetamine Dimesylate) .... Take One Tab in The Morning 9)  Lorazepam 0.5 Mg Tabs (Lorazepam) .... As Needed For Anxiety 10)  Ibuprofen 200 Mg Tabs (Ibuprofen) .... As Needed 11)  Fish Oil 1200 Mg Caps (Omega-3 Fatty Acids) .... Two Times A Day 12)  Cvs Vit D 5000 High-Potency 5000 Unit Caps (Cholecalciferol) .... Once Daily 13)  Daily Vitamins  Tabs (Multiple Vitamin) .... Once Daily 14)  Vicodin Es 7.5-750 Mg Tabs (Hydrocodone-Acetaminophen) .... 1/2 At Bedtime As Needed  Allergies (verified): 1)  ! Erythromycin  Comments:  Nurse/Medical Assistant: The patient's medications and allergies were reviewed with the patient and were updated in the Medication and Allergy Lists. 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 Packs/Day:  0.5 STD  Risk:  never Drug Use:  never Seat Belt Use:  always  Review of Systems       No fever/sweats/weakness, unexplained weight loss/gain.  No vison changes.  No difficulty hearing/ringing in ears, hay fever/allergies.  No chest pain/discomfort, palpitations.  No Br lump/nipple discharge.  No cough/wheeze.  No blood in BM, nausea/vomiting/diarrhea.  No nighttime urination, leaking urine, unusual vaginal bleeding, discharge (penis or vagina).  No muscle/joint pain. No rash, change in mole.  No HA, memory loss.  No anxiety, sleep d/o, depression.  No easy bruising/bleeding, unexplained lump   Physical Exam  General:  Well-developed,well-nourished,in no acute distress; alert,appropriate and cooperative throughout examination Head:  Normocephalic and atraumatic without obvious abnormalities. No apparent alopecia or balding. Neck:  No deformities, masses, or tenderness noted. no thyromegaly Lungs:  Normal respiratory effort, chest expands symmetrically. Lungs are clear to auscultation, no crackles or wheezes. Heart:  Normal rate and regular rhythm. S1 and S2 normal without gallop, murmur, click, rub or other extra sounds. Abdomen:  Bowel sounds positive,abdomen soft and non-tender without masses, organomegaly or hernias noted. Msk:  she is tender over her thoracic spine.  She is slightly tender along the paraspinous muscles and underneath the shoulder blade on the right.  She is nontender over her shoulder.  She also is nontender of the breastbone which is where she is having some of her discomfort today.  Her right shoulder has normal range of motion. Skin:  no skin rash on her back   Impression & Recommendations:  Problem # 1:  NAUSEA (ICD-787.02) she actually is feeling some better today.  She said that she got worse last night but today is better.  On the set her up for an abdominal ultrasound to evaluate her gallbladder.  I reviewed her labs with her and let her know that her white count was normal  thus there is no sign of acute infection.  I have no reason to suspect pneumonia or other conditions at this point she has been afebrile and has not noticed any change in her breathing or discomfort with inspiration.  I did recommend starting a brat diet.  She is avoid anything greasy or fatty which is similar to gallbladder toe we are able to get her ultrasound scheduled.  If she is doing well over the weekend and she can gradually increase and adjust her diet.  She is not  eaten anything today. Orders: T-Ultrasound Abdominal, Complete (04540)  Problem # 2:  SHOULDER PAIN, RIGHT (ICD-719.41) It is really more near her scapula.  It is not worse with motion so I am not sure this is MSK. will eval the GB.  She has soem left over hydrocodone at home so she can use this.  Her updated medication list for this problem includes:    Aspirin 81 Mg Tbec (Aspirin) .Marland Kitchen... Take one tablet by mouth daily    Ibuprofen 200 Mg Tabs (Ibuprofen) .Marland Kitchen... As needed  Vicodin Es 7.5-750 Mg Tabs (Hydrocodone-acetaminophen) .Marland Kitchen... 1/2 at bedtime as needed  Complete Medication List: 1)  Aspirin 81 Mg Tbec (Aspirin) .... Take one tablet by mouth daily 2)  Zoloft 100 Mg Tabs (Sertraline hcl) .... 2 tab by mouth once daily 3)  Prilosec 20 Mg Cpdr (Omeprazole) .Marland Kitchen.. 1 tab by mouth once daily 4)  Prevident 5000 Dry Mouth 1.1 % Pste (Sodium fluoride) .... Use ad directed 5)  Simvastatin 80 Mg Tabs (Simvastatin) .... Take one tab at bedtime 6)  Triamterene-hctz 37.5-25 Mg Tabs (Triamterene-hctz) .Marland Kitchen.. 1 tab m, w, f 7)  Vyvanse 50 Mg Caps (Lisdexamfetamine dimesylate) .... Take one tab in the morning 8)  Lorazepam 0.5 Mg Tabs (Lorazepam) .... As needed for anxiety 9)  Ibuprofen 200 Mg Tabs (Ibuprofen) .... As needed 10)  Fish Oil 1200 Mg Caps (Omega-3 fatty acids) .... Two times a day 11)  Cvs Vit D 5000 High-potency 5000 Unit Caps (Cholecalciferol) .... Once daily 12)  Daily Vitamins Tabs (Multiple vitamin) .... Once daily 13)   Vicodin Es 7.5-750 Mg Tabs (Hydrocodone-acetaminophen) .... 1/2 at bedtime as needed  Patient Instructions: 1)  WE will call you with the referral for the ultrasound.     Orders Added: 1)  T-Ultrasound Abdominal, Complete [76700] 2)  Est. Patient Level IV [04540]

## 2010-11-19 NOTE — Op Note (Signed)
Summary: Back Surgery/[facility]  Back Surgery/[facility]   Imported By: Lanelle Bal 10/26/2010 12:13:29  _____________________________________________________________________  External Attachment:    Type:   Image     Comment:   External Document

## 2010-11-19 NOTE — Progress Notes (Signed)
Summary: ? shingles  Phone Note Call from Patient Call back at Home Phone [phone]   Caller: Patient Call For: [place] Summary of Call: Seen yesterday and states her pain is now across her shoulder blades and has 3 blisters on chest above breast and ? if shingles and wanted to know if you would call her in something or if she just needed to wait. Hated to go thru holiday weekend and it get worse Initial call taken by: Kathlene November LPN,  October 16, 2010 12:53 PM  Follow-up for Phone Call        I already send med over. Sue Lush had already talked to her about the blisters but I don't know if andrea had a change to call her back adn let her know I sent the meds for shingles. Tell her to f/u next week.  Follow-up by: Nani Gasser MD,  October 16, 2010 12:56 PM  Additional Follow-up for Phone Call Additional follow up Details #1::        Pt notified med sent to pharmacy Additional Follow-up by: Kathlene November LPN,  October 16, 2010 1:01 PM

## 2011-01-25 ENCOUNTER — Encounter (HOSPITAL_COMMUNITY): Payer: Self-pay | Admitting: Psychiatry

## 2011-01-26 ENCOUNTER — Encounter (INDEPENDENT_AMBULATORY_CARE_PROVIDER_SITE_OTHER): Payer: BC Managed Care – PPO | Admitting: Psychiatry

## 2011-01-26 DIAGNOSIS — F909 Attention-deficit hyperactivity disorder, unspecified type: Secondary | ICD-10-CM

## 2011-01-26 DIAGNOSIS — F339 Major depressive disorder, recurrent, unspecified: Secondary | ICD-10-CM

## 2011-03-22 ENCOUNTER — Encounter: Payer: Self-pay | Admitting: Family Medicine

## 2011-03-23 ENCOUNTER — Encounter: Payer: Self-pay | Admitting: Family Medicine

## 2011-03-23 ENCOUNTER — Ambulatory Visit (INDEPENDENT_AMBULATORY_CARE_PROVIDER_SITE_OTHER): Payer: BC Managed Care – PPO | Admitting: Family Medicine

## 2011-03-23 VITALS — BP 157/104 | HR 93 | Temp 98.0°F | Ht 63.0 in | Wt 129.0 lb

## 2011-03-23 DIAGNOSIS — I10 Essential (primary) hypertension: Secondary | ICD-10-CM

## 2011-03-23 DIAGNOSIS — F17200 Nicotine dependence, unspecified, uncomplicated: Secondary | ICD-10-CM

## 2011-03-23 DIAGNOSIS — L679 Hair color and hair shaft abnormality, unspecified: Secondary | ICD-10-CM

## 2011-03-23 MED ORDER — CEPHALEXIN 250 MG PO CAPS: 250.0000 mg | ORAL_CAPSULE | Freq: Four times a day (QID) | ORAL | Status: AC

## 2011-03-23 MED ORDER — SIMVASTATIN 80 MG PO TABS: 80.0000 mg | ORAL_TABLET | Freq: Every day | ORAL | Status: DC

## 2011-03-23 MED ORDER — VARENICLINE TARTRATE 0.5 MG X 11 & 1 MG X 42 PO MISC: ORAL | Status: AC

## 2011-03-23 MED ORDER — TRIAMTERENE-HCTZ 37.5-25 MG PO CAPS: ORAL_CAPSULE | ORAL | Status: DC

## 2011-03-23 NOTE — Progress Notes (Signed)
  Subjective:    Patient ID: Alicia Aguilar, female    DOB: 18-Sep-1946, 65 y.o.   MRN: [id number]  HPI Noticed her hair was matting about 2 months ago. NOw getting larger areas. She went online and read about some different treatments. Used olive oil and  A baby comb to get them out. She says she's able to get out but not within it moves..  No changes to her scalp. She is now getting bald spots. She has fairly short hair and denies any excessive friction to her head. She does attend or hot.  Tob abuse - wants to quit smoking .She had quit for 6-8  Months. SHe has tried chantix before. She has tried patches and wellbutrin at one time.  She would like to retry the Chantix.  No SE with medication.   Review of Systems     Objective:   Physical Exam  Constitutional: She appears well-developed and well-nourished.  HENT:  Head: Normocephalic and atraumatic.  Skin:       She does have a scab on the top of her head with  Some dry scaling. Also has several bald spots with broken hairs.  I dont' actually see any matted hair.   Her hair is dry.        Assessment & Plan:  Matted hair - based on her exam I am most concerned about mechanical trauma. Not convinced she has treated also in her hair. She seems to be constantly pulling at them while she is talking to me in the room. I think she may be self inducing this trauma. Recommend using a good conditioner to help soften her hair. This will also make her hair less peripheral and less likely to tangle.  Will use the  ABX for the infection on the scalp, and referred to dermatology.  If not then will refer to Dr. Randye Lobo at [facility].  I also discussed with her to stop pulling at the hairs as she is breaking them and pulling them out.

## 2011-03-23 NOTE — Assessment & Plan Note (Addendum)
I be happy to help her quit smoking. She's tried Chantix in the past and did well with that then I will be happy to restart it. We will need to monitor her mood as she does have a history of depression

## 2011-03-23 NOTE — Assessment & Plan Note (Signed)
She uses her diuretic approximately 3 days per week but this does actually typically control her blood pressure. She's been out of her medication for a couple of weeks which is why she says her blood pressure is high today. She does need a refill.

## 2011-04-21 ENCOUNTER — Encounter: Payer: Self-pay | Admitting: Emergency Medicine

## 2011-04-21 ENCOUNTER — Inpatient Hospital Stay (INDEPENDENT_AMBULATORY_CARE_PROVIDER_SITE_OTHER)
Admission: RE | Admit: 2011-04-21 | Discharge: 2011-04-21 | Disposition: A | Discharge status: Home / Self Care | Payer: Medicare Other | Source: Ambulatory Visit | Attending: Emergency Medicine | Admitting: Emergency Medicine

## 2011-04-21 DIAGNOSIS — M549 Dorsalgia, unspecified: Secondary | ICD-10-CM

## 2011-04-26 ENCOUNTER — Telehealth: Payer: Self-pay | Admitting: Family Medicine

## 2011-04-26 NOTE — Telephone Encounter (Signed)
Pt called and said she had a voice mail regarding a derm referral and the VM message was cut off before she could get all the pertinent information. Plan:  Pt. Notified with the information she needed. Jarvis Newcomer, LPN Domingo Dimes

## 2011-04-27 ENCOUNTER — Encounter (INDEPENDENT_AMBULATORY_CARE_PROVIDER_SITE_OTHER): Payer: BC Managed Care – PPO | Admitting: Psychiatry

## 2011-04-27 DIAGNOSIS — F339 Major depressive disorder, recurrent, unspecified: Secondary | ICD-10-CM

## 2011-04-27 DIAGNOSIS — F909 Attention-deficit hyperactivity disorder, unspecified type: Secondary | ICD-10-CM

## 2011-05-11 ENCOUNTER — Other Ambulatory Visit: Payer: Self-pay | Admitting: Family Medicine

## 2011-05-12 ENCOUNTER — Other Ambulatory Visit: Payer: Self-pay | Admitting: Family Medicine

## 2011-05-13 NOTE — Telephone Encounter (Signed)
Does she need this changed to a continuing pack?

## 2011-05-14 ENCOUNTER — Other Ambulatory Visit: Payer: Self-pay | Admitting: Family Medicine

## 2011-05-24 NOTE — Telephone Encounter (Signed)
Let call pharm to confirm if needs starter or continuing pack.

## 2011-05-26 NOTE — Telephone Encounter (Signed)
Called and left message for pt to call back and let us know if she needs started pak or cont pak

## 2011-05-28 ENCOUNTER — Inpatient Hospital Stay (INDEPENDENT_AMBULATORY_CARE_PROVIDER_SITE_OTHER)
Admission: RE | Admit: 2011-05-28 | Discharge: 2011-05-28 | Disposition: A | Discharge status: Home / Self Care | Payer: Medicare Other | Source: Ambulatory Visit | Attending: Family Medicine | Admitting: Family Medicine

## 2011-05-28 ENCOUNTER — Encounter: Payer: Self-pay | Admitting: Family Medicine

## 2011-05-28 DIAGNOSIS — R05 Cough: Secondary | ICD-10-CM

## 2011-05-28 DIAGNOSIS — J019 Acute sinusitis, unspecified: Secondary | ICD-10-CM

## 2011-05-28 DIAGNOSIS — J00 Acute nasopharyngitis [common cold]: Secondary | ICD-10-CM

## 2011-07-28 ENCOUNTER — Encounter (INDEPENDENT_AMBULATORY_CARE_PROVIDER_SITE_OTHER): Payer: Medicare Other | Admitting: Psychiatry

## 2011-07-28 DIAGNOSIS — F909 Attention-deficit hyperactivity disorder, unspecified type: Secondary | ICD-10-CM

## 2011-07-28 DIAGNOSIS — F339 Major depressive disorder, recurrent, unspecified: Secondary | ICD-10-CM

## 2011-08-02 ENCOUNTER — Other Ambulatory Visit: Payer: Self-pay | Admitting: Family Medicine

## 2011-08-12 ENCOUNTER — Encounter (HOSPITAL_COMMUNITY): Payer: Self-pay

## 2011-08-12 ENCOUNTER — Ambulatory Visit (INDEPENDENT_AMBULATORY_CARE_PROVIDER_SITE_OTHER): Payer: Medicare Other | Admitting: Family Medicine

## 2011-08-12 ENCOUNTER — Encounter: Payer: Self-pay | Admitting: Family Medicine

## 2011-08-12 VITALS — BP 148/94 | HR 76 | Wt 129.0 lb

## 2011-08-12 DIAGNOSIS — Z23 Encounter for immunization: Secondary | ICD-10-CM

## 2011-08-12 DIAGNOSIS — I10 Essential (primary) hypertension: Secondary | ICD-10-CM

## 2011-08-12 DIAGNOSIS — R002 Palpitations: Secondary | ICD-10-CM

## 2011-08-12 DIAGNOSIS — M545 Low back pain: Secondary | ICD-10-CM

## 2011-08-12 NOTE — Patient Instructions (Signed)
Please follow up  In one week to recheck you blood pressure.

## 2011-08-12 NOTE — Progress Notes (Signed)
Subjective:    Patient ID: Alicia Aguilar, female    DOB: 1946-08-24, 65 y.o.   MRN: [id number]  HPISxs started about 2 weeks ago. Says she is getting some waves in her chest. She says it last minutes at a time.  Denies any pain.  Some belching and gas but this is not new. Says notices it more at night. No worsening or alleviating sxs. No known turgor's. No prior history of arrhythmia.  Does have a fam hx of heart dz.  She is also having back pain in the low back pain.  No radiation into her legs. No numbness or tingling in her feet.  Hx of back surgery a year ago.  She also has a hx of stenosis per her report.  Back is worse when she walks at the grocery store or any length of time.  IBU - helps some and using iccing and heating too. Hx of inverted T waves on EKG. She is a smoker. No recent GERD symptoms and she does take ranitidine.  At the end of the visit she said she remembered that she has had a history of hypokalemia on multiple occasions. In fact she used to be on potassium for quite some time. She has not taken potassium in several months. Review of Systems     BP 148/94  Pulse 76  Wt 129 lb (58.514 kg)    Allergies  Allergen Reactions  . Erythromycin     Past Medical History  Diagnosis Date  . 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  . Hyperlipidemia     Past Surgical History  Procedure Date  . Tubal ligation     bilateral  . Retinal tear repair cryotherapy   . Laminectomy 03-2010    T12 and L1    History   Social History  . Marital Status: Divorced    Spouse Name: N/A    Number of Children: N/A  . Years of Education: N/A   Occupational History  . Not on file.   Social History Main Topics  . Smoking status: Current Everyday Smoker -- 0.5 packs/day for 20 years    Types: Cigarettes  . Smokeless tobacco: Not on file  . Alcohol Use: Yes     socially  . Drug Use: No  . Sexually Active:      A/R anaylst, retired,  divorced, 2 children, no regular exercise, 2-4 caffeine drinks daily.   Other Topics Concern  . Not on file   Social History Narrative  . No narrative on file    Family History  Problem Relation Age of Onset  . Parkinsonism      family history  . Diabetes      family history  . Hypertension      family history  . Heart block      family history/cardiovascular disease  . Diabetes Mother   . Coronary artery disease Mother     CABG  . Dementia Father   . Coronary artery disease Sister     Ms. Seaberg had no medications administered during this visit.  Objective:   Physical Exam  Constitutional: She is oriented to person, place, and time. She appears well-developed and well-nourished.  HENT:  Head: Normocephalic and atraumatic.  Neck: Neck supple. No thyromegaly present.  Cardiovascular: Normal rate, regular rhythm and normal heart sounds.        No carotid bruits.   Pulmonary/Chest: Effort normal  and breath sounds normal.  Musculoskeletal: She exhibits no edema.       Normal flexion. Dec extension. Symmetric rotation right and left and symmetric side bending. Neg straight leg raise, Hip, knee, and ankle strength 5/5 bilat.  Patellar and achilles reflexes are 2+ bilat. surgical scar is well-healed. This is actually above the area of discomfort. No paraspinous muscle tenderness. No tenderness over the lumbar spine itself.  Lymphadenopathy:    She has no cervical adenopathy.  Neurological: She is alert and oriented to person, place, and time.  Skin: Skin is warm and dry.  Psychiatric: She has a normal mood and affect. Her behavior is normal.          Assessment & Plan:  Heart flutters/palpitations-I think her description of "waves in her chest" is likely heart flutters. EKG today shows NSR, rte of 83 bpm, no acute changes. Inverted T wave in V2. This is reassuring. I will check some lab work today including a complete metabolic panel and CBC. Will rule out anemia check her  thyroid as well. If this is normal and the sensation persists then I recommend possible evaluation by cardiology. Also consider hypokalemia since his been recurrent in the past and she does take a diuretic.  HTN- elevated today.  Recheck in one week.  Back Pain - I think this is most likely part of her arthritis. The pain infection below where she had her surgery a year ago. Certainly we can consider some stretches and possibly physical therapy. She could also consider going back to see her surgeon if she would like. She uses ibuprofen as needed for pain control and this does seem to help her.

## 2011-08-13 LAB — CBC WITH DIFFERENTIAL/PLATELET
Basophils Absolute: 0 10*3/uL (ref 0.0–0.1)
Basophils Relative: 0 % (ref 0–1)
Eosinophils Absolute: 0.4 10*3/uL (ref 0.0–0.7)
Eosinophils Relative: 5 % (ref 0–5)
HCT: 41.5 % (ref 36.0–46.0)
MCH: 29.5 pg (ref 26.0–34.0)
MCV: 89.4 fL (ref 78.0–100.0)
Neutrophils Relative %: 51 % (ref 43–77)
Platelets: 265 10*3/uL (ref 150–400)
RDW: 13.3 % (ref 11.5–15.5)

## 2011-08-13 LAB — COMPLETE METABOLIC PANEL WITH GFR
ALT: 12 U/L (ref 0–35)
Albumin: 4.4 g/dL (ref 3.5–5.2)
Alkaline Phosphatase: 92 U/L (ref 39–117)
BUN: 24 mg/dL — ABNORMAL HIGH (ref 6–23)
Calcium: 9.4 mg/dL (ref 8.4–10.5)
Creat: 0.95 mg/dL (ref 0.50–1.10)
Potassium: 3.8 mEq/L (ref 3.5–5.3)
Sodium: 140 mEq/L (ref 135–145)
Total Protein: 7.1 g/dL (ref 6.0–8.3)

## 2011-08-13 LAB — TSH: TSH: 0.832 u[IU]/mL (ref 0.350–4.500)

## 2011-08-16 ENCOUNTER — Telehealth: Payer: Self-pay | Admitting: Family Medicine

## 2011-08-16 NOTE — Telephone Encounter (Signed)
Pt called for her lab results specifically the potassium level. Plan:  Pt informed that it looks like Dr. Linford Arnold has not reviewed as of yet, but someone should be calling her soon about the results.  Pt voiced understanding. Told pt will send a mess to Dr. Linford Arnold that she had called. Routed to Dr. Marlyne Beards, LPN Domingo Dimes

## 2011-08-16 NOTE — Telephone Encounter (Signed)
Pt. Called to get her lab results. Plan:  Pt call was returned, however; pt did not answer.  LMOM that call returned and to call back at her convenience. Jarvis Newcomer, LPN Domingo Dimes

## 2011-08-16 NOTE — Telephone Encounter (Signed)
I reviewed this evening.  See results note.

## 2011-08-17 ENCOUNTER — Telehealth: Payer: Self-pay | Admitting: Family Medicine

## 2011-08-17 DIAGNOSIS — R0602 Shortness of breath: Secondary | ICD-10-CM

## 2011-08-17 NOTE — Telephone Encounter (Signed)
Pt called and says she has decided to be referred to the cardiologist because she says she is experiencing shortness of breath.  Has seen Dr. Sanjuana Kava at [facility] in the past and prefers to go back there.    Plan:  Routed to the provider. Jarvis Newcomer, LPN Domingo Dimes

## 2011-08-17 NOTE — Telephone Encounter (Signed)
Referral entered  

## 2011-08-17 NOTE — Telephone Encounter (Signed)
Pt was informed of lab results.  Had to [facility] therefore, told pt to call back and speak with triage nurse if she had questions about her labwork. Jarvis Newcomer, LPN Domingo Dimes

## 2011-08-17 NOTE — Telephone Encounter (Signed)
Informed pt of lab results.  Had to [facility]. Jarvis Newcomer, LPN Domingo Dimes

## 2011-08-17 NOTE — Telephone Encounter (Signed)
Message copied by Gifford Shave on Tue Aug 17, 2011  9:07 AM ------      Message from: Nani Gasser D      Created: Mon Aug 16, 2011  8:34 PM       Kidney func is stable. Sodium is nl. On  Labs look like a little dry.  Thyroid is nl.  Blood count is nl. No anemia.  I rec eval by cards if she is still having the palpitations. Let me know if ok with that.

## 2011-08-18 NOTE — Telephone Encounter (Signed)
Pt notified by [facility] for her and instructing her that her cardiology referral had been set up. Jarvis Newcomer, LPN Domingo Dimes

## 2011-08-26 ENCOUNTER — Ambulatory Visit (INDEPENDENT_AMBULATORY_CARE_PROVIDER_SITE_OTHER): Payer: Medicare Other | Admitting: Family Medicine

## 2011-08-26 VITALS — BP 132/82 | HR 90 | Temp 98.6°F | Wt 132.0 lb

## 2011-08-26 DIAGNOSIS — I10 Essential (primary) hypertension: Secondary | ICD-10-CM

## 2011-08-26 DIAGNOSIS — J029 Acute pharyngitis, unspecified: Secondary | ICD-10-CM

## 2011-08-26 MED ORDER — DOXYCYCLINE HYCLATE 100 MG PO TABS: 100.0000 mg | ORAL_TABLET | Freq: Two times a day (BID) | ORAL | Status: AC

## 2011-08-26 MED ORDER — FLUTICASONE PROPIONATE 50 MCG/ACT NA SUSP: 2.0000 | Freq: Every day | NASAL | Status: DC

## 2011-08-26 NOTE — Patient Instructions (Signed)
Call if not better in one week.  

## 2011-08-26 NOTE — Progress Notes (Signed)
  Subjective:    Patient ID: Alicia Aguilar, female    DOB: [birth year], 65 y.o.   MRN: [id number]  HPI HTN- Doing well. The chest waves are better.  No chest pain or shortness of breath.  Pharyngitis - + HA and constant drainage for 6 days.  Says the Right facial cheeck has been swollen for a couple days. Right ear pain.  Was tx for a sinus infection about a month ago. Would like refill on the flonase. No fever. She is not taking any medications currently. She had been previously using Flonase which she felt was very helpful for her but ran out and would like a prescription.  Her Back is better overall. Please see previous office note..    Review of Systems     Objective:   Physical Exam  Constitutional: She is oriented to person, place, and time. She appears well-developed and well-nourished.  HENT:  Head: Normocephalic and atraumatic.  Right Ear: External ear normal.  Left Ear: External ear normal.  Nose: Nose normal.  Mouth/Throat: Oropharynx is clear and moist.       TMs and canals are clear.   Eyes: Conjunctivae and EOM are normal. Pupils are equal, round, and reactive to light.  Neck: Neck supple. No thyromegaly present.  Cardiovascular: Normal rate, regular rhythm and normal heart sounds.   Pulmonary/Chest: Effort normal and breath sounds normal. She has no wheezes.  Lymphadenopathy:    She has no cervical adenopathy.  Neurological: She is alert and oriented to person, place, and time.  Skin: Skin is warm and dry.  Psychiatric: She has a normal mood and affect. Her behavior is normal.          Assessment & Plan:  HTN -Bp looks better today.  At goal. Followup in 6 months.  Pharyngitis /Sinusitis - Rapid strep is negative.  Restart your flonase. New rx sent. Will tx with doxy. Call if not better in one week.

## 2011-09-14 ENCOUNTER — Ambulatory Visit: Payer: Medicare Other | Admitting: Cardiovascular Disease

## 2011-09-15 ENCOUNTER — Encounter (HOSPITAL_COMMUNITY): Payer: Self-pay | Admitting: Psychiatry

## 2011-09-15 ENCOUNTER — Ambulatory Visit (INDEPENDENT_AMBULATORY_CARE_PROVIDER_SITE_OTHER): Payer: Medicare Other | Admitting: Psychiatry

## 2011-09-15 VITALS — BP 122/82 | Ht 64.0 in | Wt 132.0 lb

## 2011-09-15 DIAGNOSIS — F909 Attention-deficit hyperactivity disorder, unspecified type: Secondary | ICD-10-CM

## 2011-09-15 DIAGNOSIS — F329 Major depressive disorder, single episode, unspecified: Secondary | ICD-10-CM

## 2011-09-15 MED ORDER — BUPROPION HCL ER (XL) 300 MG PO TB24: 300.0000 mg | ORAL_TABLET | Freq: Every day | ORAL | Status: DC

## 2011-09-15 MED ORDER — LISDEXAMFETAMINE DIMESYLATE 70 MG PO CAPS: 70.0000 mg | ORAL_CAPSULE | ORAL | Status: DC

## 2011-09-15 NOTE — Progress Notes (Signed)
   [facility] Follow-up Outpatient Visit  Alicia Aguilar Feb 02, 1946   Subjective: The patient is a 65 year old female who has not been followed by [facility] September of 2008. 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 At last appointment patient been feeling more depressed. At that point she was only on Vyvanse and Zoloft. I added Wellbutrin XL 150 mg. She appears to be doing much better. She has more energy. She's been doing more around the house. The patient has adopted a 65 year-old schitzu named Trixie.  She is getting a new puppy on Friday of the same breed. She's not sure yet which is going to name it. She will concern about getting a dog that young but every dog that she gets through adoption tends to die early. She is excited about having a puppy although nervous at the same time. She feels like she did be doing a little bit better at this point. Filed Vitals:   09/15/11 1413  BP: 122/82    Mental Status Examination  Appearance: Neatly dressed Alert: Yes Attention: good  Cooperative: Yes Eye Contact: Good Speech: Normal rate rhythm and volume Psychomotor Activity: Normal Memory/Concentration: Intact Oriented: person, place, time/date and situation Mood: Euthymic Affect: Full Range Thought Processes and Associations: Logical Fund of Knowledge: Good Thought Content: No suicidal homicidal thoughts Insight: Fair Judgement: Fair  Diagnosis: Maj. depressive disorder recurrent moderate, ADHD inattentive type  Treatment Plan: At this point will increase the patient's Wellbutrin XL to 300 mg daily. We'll continue the Zoloft and Vyvanse as they are. I will see the patient back in 2 months.  Jamse Mead, MD

## 2011-09-20 NOTE — Progress Notes (Signed)
Summary: BACK PAIN   Vital Signs:  Patient Profile:   65 Years Old Female CC:      back pain x 2 days Height:     63.5 inches Weight:      122 pounds O2 Sat:      99 % O2 treatment:    Room Air Temp:     98.2 degrees F oral Pulse rate:   104 / minute Resp:     16 per minute BP sitting:   138 / 92  (left arm) Cuff size:   regular  Pt. in pain?   yes    Location:   back    Type:       dull  Vitals Entered By: Lajean Saver RN (April 21, 2011 4:14 PM)                   Updated Prior Medication List: ASPIRIN 81 MG TBEC (ASPIRIN) Take one tablet by mouth daily ZOLOFT 100 MG TABS (SERTRALINE HCL) 2 tab by mouth once daily PRILOSEC 20 MG CPDR (OMEPRAZOLE) 1 tab by mouth once daily PREVIDENT 5000 DRY MOUTH 1.1 % PSTE (SODIUM FLUORIDE) use ad directed SIMVASTATIN 80 MG TABS (SIMVASTATIN) take one tab at bedtime TRIAMTERENE-HCTZ 37.5-25 MG TABS (TRIAMTERENE-HCTZ) 1 tab m, w, f VYVANSE 50 MG CAPS (LISDEXAMFETAMINE DIMESYLATE) take one tab in the morning LORAZEPAM 0.5 MG TABS (LORAZEPAM) as needed for anxiety IBUPROFEN 200 MG TABS (IBUPROFEN) as needed FISH OIL 1200 MG CAPS (OMEGA-3 FATTY ACIDS) two times a day [employer] VIT D 5000 HIGH-POTENCY 5000 UNIT CAPS (CHOLECALCIFEROL) once daily DAILY VITAMINS  TABS (MULTIPLE VITAMIN) once daily  Current Allergies (reviewed today): ! ERYTHROMYCINHistory of Present Illness History from: patient Chief Complaint: back pain x 2 days History of Present Illness: The patient presents today with back pain.  She has a history of disk herniation with spinal surgery about 1 year ago.  No CP, SOB. Location: Upper R back Timing: x2 days Description: sharp, started after throwing a trash bag Modifying factors:  Worse with: movement Better with: ice, heat, ibu, pain meds Trauma: no Bladder/bowel incontinence: no Weakness: no Fever/chills: no Night pain: no Unexplained weight loss: no Cancer/immunosuppression: no PMH of osteoporosis or chronic  steroid use:  no  REVIEW OF SYSTEMS Constitutional Symptoms      Denies fever, chills, night sweats, weight loss, weight gain, and fatigue.  Eyes       Denies change in vision, eye pain, eye discharge, glasses, contact lenses, and eye surgery. Ear/Nose/Throat/Mouth       Denies hearing loss/aids, change in hearing, ear pain, ear discharge, dizziness, frequent runny nose, frequent nose bleeds, sinus problems, sore throat, hoarseness, and tooth pain or bleeding.  Respiratory       Denies dry cough, productive cough, wheezing, shortness of breath, asthma, bronchitis, and emphysema/COPD.  Cardiovascular       Denies murmurs, chest pain, and tires easily with exhertion.    Gastrointestinal       Denies stomach pain, nausea/vomiting, diarrhea, constipation, blood in bowel movements, and indigestion. Genitourniary       Denies painful urination, kidney stones, and loss of urinary control. Neurological       Denies paralysis, seizures, and fainting/blackouts. Musculoskeletal       Denies muscle pain, joint pain, joint stiffness, decreased range of motion, redness, swelling, muscle weakness, and gout.  Skin       Denies bruising, unusual mles/lumps or sores, and hair/skin or nail changes.  Psych  Denies mood changes, temper/anger issues, anxiety/stress, speech problems, depression, and sleep problems. Other Comments: Patient c/o lower thoracic back pain. She threw out the trash yesterday, twisting and felt a catch in her back. SHe has used ice and heat. She is worried the pain is not from her throwing out the trash   Past History:  Past Medical History: Reviewed history from 10/15/2010 and no changes required. 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 Norva Karvonen Hyperlipidemia  Past Surgical History: Reviewed history from 10/15/2010 and no changes required. Childbirth  x2 BTL Retina Tear T12 & L1 Laminectomy  in  June 2011 Physical Exam General appearance: well developed, well nourished, no acute distress Heart: regular rate and  rhythm, no murmur MSE: oriented to time, place, and person Back & neck FROM,  spurling negative, +TTP R parascapular especially rhomboids, mildly levator scapula with mild spasm felt.  No central bony tenderness.  Shoulder FROM.  Distal NV status intact. Assessment New Problems: BACK PAIN (ICD-724.5)   Plan New Orders: Est. Patient Level II [47829] Planning Comments:   I think this is a back strain of her rhomboids.  She doesn't have any red flags to concern her with disk herniation, cardiac, or other.  Would continue to treat with ice, heat, gentle ROM, ibuprofen, and her Vicodin as needed.  If worsening or new symptoms, would do Xrays. She can follow up with Dr. Linford Arnold if not improving, but should realistically expect soreness for a week or so.    The patient and/or caregiver has been counseled thoroughly with regard to medications prescribed including dosage, schedule, interactions, rationale for use, and possible side effects and they verbalize understanding.  Diagnoses and expected course of recovery discussed and will return if not improved as expected or if the condition worsens. Patient and/or caregiver verbalized understanding.   Orders Added: 1)  Est. Patient Level II [56213]

## 2011-09-20 NOTE — Progress Notes (Signed)
Summary: SORE THROAT/SINUS ISSUES   Vital Signs:  Patient Profile:   65 Years Old Female CC:      sore throat, sinus pain Height:     63.5 inches Weight:      126 pounds O2 Sat:      99 % O2 treatment:    Room Air Temp:     99.0 degrees F oral Pulse rate:   90 / minute Resp:     14 per minute BP sitting:   121 / 79  (left arm) Cuff size:   regular  Vitals Entered By: Lajean Saver RN (May 28, 2011 11:09 AM)                  Prior Medication List:  ASPIRIN 81 MG TBEC (ASPIRIN) Take one tablet by mouth daily ZOLOFT 100 MG TABS (SERTRALINE HCL) 2 tab by mouth once daily PRILOSEC 20 MG CPDR (OMEPRAZOLE) 1 tab by mouth once daily PREVIDENT 5000 DRY MOUTH 1.1 % PSTE (SODIUM FLUORIDE) use ad directed SIMVASTATIN 80 MG TABS (SIMVASTATIN) take one tab at bedtime TRIAMTERENE-HCTZ 37.5-25 MG TABS (TRIAMTERENE-HCTZ) 1 tab m, w, f VYVANSE 50 MG CAPS (LISDEXAMFETAMINE DIMESYLATE) take one tab in the morning LORAZEPAM 0.5 MG TABS (LORAZEPAM) as needed for anxiety IBUPROFEN 200 MG TABS (IBUPROFEN) as needed FISH OIL 1200 MG CAPS (OMEGA-3 FATTY ACIDS) two times a day [facility] VIT D 5000 HIGH-POTENCY 5000 UNIT CAPS (CHOLECALCIFEROL) once daily DAILY VITAMINS  TABS (MULTIPLE VITAMIN) once daily   Updated Prior Medication List: ASPIRIN 81 MG TBEC (ASPIRIN) Take one tablet by mouth daily ZOLOFT 100 MG TABS (SERTRALINE HCL) 2 tab by mouth once daily PRILOSEC 20 MG CPDR (OMEPRAZOLE) 1 tab by mouth once daily PREVIDENT 5000 DRY MOUTH 1.1 % PSTE (SODIUM FLUORIDE) use ad directed SIMVASTATIN 80 MG TABS (SIMVASTATIN) take one tab at bedtime TRIAMTERENE-HCTZ 37.5-25 MG TABS (TRIAMTERENE-HCTZ) 1 tab m, w, f VYVANSE 50 MG CAPS (LISDEXAMFETAMINE DIMESYLATE) take one tab in the morning LORAZEPAM 0.5 MG TABS (LORAZEPAM) as needed for anxiety IBUPROFEN 200 MG TABS (IBUPROFEN) as needed FISH OIL 1200 MG CAPS (OMEGA-3 FATTY ACIDS) two times a day [facility] VIT D 5000 HIGH-POTENCY 5000 UNIT CAPS  (CHOLECALCIFEROL) once daily DAILY VITAMINS  TABS (MULTIPLE VITAMIN) once daily  Current Allergies (reviewed today): ! ERYTHROMYCINHistory of Present Illness Chief Complaint: sore throat, sinus pain History of Present Illness: Patient w/ symptoms of a sinus infection and pressure near upper teeth on th R side. Sore throat for 2 days.   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) FAMILY HISTORY DIABETES 1ST DEGREE RELATIVE (ICD-V18.0)   Current Meds ASPIRIN 81 MG TBEC (ASPIRIN) Take one tablet by mouth daily ZOLOFT 100 MG TABS (SERTRALINE HCL) 2 tab by mouth once daily PRILOSEC 20 MG CPDR (OMEPRAZOLE) 1 tab by mouth once daily PREVIDENT 5000 DRY MOUTH 1.1 % PSTE (SODIUM FLUORIDE) use ad directed SIMVASTATIN 80 MG TABS (SIMVASTATIN) take one tab at bedtime TRIAMTERENE-HCTZ 37.5-25 MG TABS (TRIAMTERENE-HCTZ) 1 tab m, w, f VYVANSE 50 MG CAPS (LISDEXAMFETAMINE DIMESYLATE) take one tab in the morning LORAZEPAM 0.5 MG TABS (LORAZEPAM) as needed for anxiety IBUPROFEN 200 MG TABS (IBUPROFEN) as needed FISH OIL 1200 MG CAPS (OMEGA-3 FATTY ACIDS) two times a day [facility] VIT D 5000 HIGH-POTENCY 5000 UNIT CAPS (CHOLECALCIFEROL) once daily DAILY VITAMINS  TABS (MULTIPLE VITAMIN) once daily AUGMENTIN 875-125 MG  TABS (AMOXICILLIN-POT CLAVULANATE) 1 by mouth 2 times daily FLONASE 50 MCG/ACT SUSP (FLUTICASONE PROPIONATE) 2 puffs each nostril q day ALLEGRA-D ALLERGY & CONGESTION 180-240 MG XR24H-TAB (FEXOFENADINE-PSEUDOEPHEDRINE) 1 by mouth q day  REVIEW OF SYSTEMS Constitutional Symptoms      Denies fever, chills, night sweats, weight loss, weight gain, and fatigue.  Eyes       Denies change in vision, eye pain, eye discharge, glasses, contact lenses,  and eye surgery. Ear/Nose/Throat/Mouth       Complains of sinus problems, sore throat, and hoarseness.      Denies hearing loss/aids, change in hearing, ear pain, ear discharge, dizziness, frequent runny nose, frequent nose bleeds, and tooth pain or bleeding.  Respiratory       Complains of dry cough.      Denies productive cough, wheezing, shortness of breath, asthma, bronchitis, and emphysema/COPD.  Cardiovascular       Denies murmurs, chest pain, and tires easily with exhertion.    Gastrointestinal       Denies stomach pain, nausea/vomiting, diarrhea, constipation, blood in bowel movements, and indigestion. Genitourniary       Denies painful urination, blood or discharge from vagina, kidney stones, and loss of urinary control. Neurological       Denies paralysis, seizures, and fainting/blackouts. Musculoskeletal       Denies muscle pain, joint pain, joint stiffness, decreased range of motion, redness, swelling, muscle weakness, and gout.  Skin       Denies bruising, unusual mles/lumps or sores, and hair/skin or nail changes.  Psych       Denies mood changes, temper/anger issues, anxiety/stress, speech problems, depression, and sleep problems.  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   Past Medical History: Reviewed history from 10/15/2010 and no changes required. Leg pain-no evidence of PVD, + stenosis of lumbars spine secondary  to disc fragments Seeing psych - Dr. Norva Karvonen Hyperlipidemia  Past Surgical History: Childbirth  x2 BTL Retina Tear T12 & L1 Laminectomy  in June 2011 top teeth removed   Family History: Reviewed history from 02/26/2010 and no changes required. Family History of Parkinsons Family History Diabetes 1st degree relative Family History Hypertension Family History of Cardiovascular disorder  Mother-deceased, age 32 DM, CAD/CABG (maternal side with strong history of CAD) Father-deceased, Parkinsons, dementia 1 sister-alive, CAD  Social History: Reviewed history from 10/15/2010 and no changes required. 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  Physical Exam General appearance: well developed, well nourished, no acute distress Head: normocephalic, atraumatic Nasal: pale, boggy, swollen nasal turbinates n o tenderness oveer maxillary sinuses Oral/Pharynx: tongue normal, posterior pharynx without erythema or exudate upper plate present Neck: supple,anterior lymphadenopathy present Extremities: normal extremities Skin: no obvious rashes or lesions MSE: oriented to time, place, and person Assessment Problems:   SHOULDER PAIN, RIGHT (ICD-719.41) HYPERLIPIDEMIA (ICD-272.4) LIMB PAIN (ICD-729.5) TOBACCO ABUSE (ICD-305.1) GERD (ICD-530.81) DEPRESSION (ICD-311) DISC DISEASE, 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  Plan New Medications/Changes: ALLEGRA-D ALLERGY & CONGESTION 180-240 MG XR24H-TAB (FEXOFENADINE-PSEUDOEPHEDRINE) 1 by mouth q day  #30 x 0,  05/28/2011, Hassan Rowan MD FLONASE 50 MCG/ACT SUSP (FLUTICASONE PROPIONATE) 2 puffs each nostril q day  #1 x 0, 05/28/2011, Hassan Rowan MD AUGMENTIN [phone] MG TABS (AMOXICILLIN-POT CLAVULANATE) 1 by mouth 2 times daily  #20 x 0, 05/28/2011, Hassan Rowan MD  New Orders: Est. Patient Level III [04540] Rapid Strep [98119] Follow Up: Follow up in 2-3 days if no improvement  The patient and/or caregiver has been counseled thoroughly with regard to medications prescribed including dosage, schedule, interactions, rationale for use, and possible side effects and they verbalize understanding.  Diagnoses and expected course of recovery discussed and will return if not improved as expected or if the condition worsens. Patient and/or caregiver verbalized understanding.  Prescriptions: ALLEGRA-D ALLERGY & CONGESTION 180-240 MG XR24H-TAB (FEXOFENADINE-PSEUDOEPHEDRINE) 1 by mouth q day  #30 x 0   Entered and Authorized by:   Hassan Rowan MD   Signed by:   Hassan Rowan MD on 05/28/2011   Method used:   Print then Give to Patient   RxID:   [id number] FLONASE 50 MCG/ACT SUSP (FLUTICASONE PROPIONATE) 2 puffs each nostril q day  #1 x 0   Entered and Authorized by:   Hassan Rowan MD   Signed by:   Hassan Rowan MD on 05/28/2011   Method used:   Print then Give to Patient   RxID:   [id number] AUGMENTIN 875-125 MG TABS (AMOXICILLIN-POT CLAVULANATE) 1 by mouth 2 times daily  #20 x 0   Entered and Authorized by:   Hassan Rowan MD   Signed by:   Hassan Rowan MD on 05/28/2011   Method used:   Print then Give to Patient   RxID:   [id number]   Patient Instructions: 1)  Please schedule a follow-up appointment as needed. 2)  Please schedule an appointment with your primary doctor in :5-7 days if not improving. 3)  Tobacco is very bad for your health and your loved ones! You Should stop smoking!. 4)  Stop Smoking Tips: Choose a Quit date. Cut down before the Quit date. decide what you will do as a  substitute when you feel the urge to smoke(gum,toothpick,exercise). 5)  Take your antibiotic as prescribed until ALL of it is gone, but stop if you develop a rash or swelling and contact our office as soon as possible. 6)  Acute sinusitis symptoms for less than 10 days are not helped by antibiotics.Use warm moist compresses, and over the counter decongestants ( only as directed). Call if no improvement in 5-7 days, sooner if increasing pain, fever, or new symptoms.  Orders Added: 1)  Est. Patient Level III [42595] 2)  Rapid Strep [63875]    Laboratory Results  Date/Time Received: May 28, 2011 11:10 AM  Date/Time Reported: May 28, 2011 11:10 AM   Other Tests  Rapid Strep: negative  Kit Test Internal QC: Negative   (Normal Range: Negative)

## 2011-10-04 ENCOUNTER — Encounter: Payer: Self-pay | Admitting: Family Medicine

## 2011-11-12 ENCOUNTER — Other Ambulatory Visit: Payer: Self-pay | Admitting: *Deleted

## 2011-11-12 ENCOUNTER — Other Ambulatory Visit (HOSPITAL_COMMUNITY): Payer: Self-pay | Admitting: Psychiatry

## 2011-11-12 MED ORDER — FLUTICASONE PROPIONATE 50 MCG/ACT NA SUSP: 2.0000 | Freq: Every day | NASAL | Status: DC

## 2011-11-12 MED ORDER — BUPROPION HCL ER (XL) 300 MG PO TB24: 300.0000 mg | ORAL_TABLET | Freq: Every day | ORAL | Status: DC

## 2011-11-15 ENCOUNTER — Ambulatory Visit (HOSPITAL_COMMUNITY): Payer: Self-pay | Admitting: Psychiatry

## 2011-11-19 ENCOUNTER — Encounter (HOSPITAL_COMMUNITY): Payer: Self-pay | Admitting: Psychiatry

## 2011-11-19 ENCOUNTER — Ambulatory Visit (INDEPENDENT_AMBULATORY_CARE_PROVIDER_SITE_OTHER): Payer: 59 | Admitting: Psychiatry

## 2011-11-19 VITALS — BP 128/80 | Ht 64.0 in | Wt 128.0 lb

## 2011-11-19 DIAGNOSIS — F329 Major depressive disorder, single episode, unspecified: Secondary | ICD-10-CM

## 2011-11-19 MED ORDER — LISDEXAMFETAMINE DIMESYLATE 70 MG PO CAPS: 70.0000 mg | ORAL_CAPSULE | ORAL | Status: DC

## 2011-11-19 NOTE — Progress Notes (Signed)
   [facility] Follow-up Outpatient Visit  HEIDIE KRALL Nov 19, 1945   Subjective: The patient is a 66 year old female who has not been followed by [facility] September of 2008. 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 At her last appointment, I increased her Wellbutrin from 150 mg daily to 300 mg daily. At that point I also continued her Vyvanse and her Zoloft. The patient presents today. She has a new puppy. The puppy is reportedly wild and keeping her very busy. The patient states that she's very tired. Her older dog is not doing well. Patient does report good sleep and appetite. She feels like her mood is a little brighter. She no she just has to get through the puppy period.   Filed Vitals:   11/19/11 1525  BP: 128/80    Mental Status Examination  Appearance: Neatly dressed Alert: Yes Attention: good  Cooperative: Yes Eye Contact: Good Speech: Normal rate rhythm and volume Psychomotor Activity: Normal Memory/Concentration: Intact Oriented: person, place, time/date and situation Mood: Euthymic Affect: Full Range Thought Processes and Associations: Logical Fund of Knowledge: Good Thought Content: No suicidal homicidal thoughts Insight: Fair Judgement: Fair  Diagnosis: Maj. depressive disorder recurrent moderate, ADHD inattentive type  Treatment Plan: We will not make any changes today. I will see the patient back in 3 months.  Jamse Mead, MD

## 2012-01-06 ENCOUNTER — Other Ambulatory Visit (HOSPITAL_COMMUNITY): Payer: Self-pay

## 2012-01-06 MED ORDER — LISDEXAMFETAMINE DIMESYLATE 70 MG PO CAPS: 70.0000 mg | ORAL_CAPSULE | ORAL | Status: DC

## 2012-01-06 NOTE — Telephone Encounter (Signed)
OK 

## 2012-02-16 ENCOUNTER — Ambulatory Visit (HOSPITAL_COMMUNITY): Payer: Self-pay | Admitting: Psychiatry

## 2012-03-15 ENCOUNTER — Ambulatory Visit (INDEPENDENT_AMBULATORY_CARE_PROVIDER_SITE_OTHER): Payer: Medicare Other | Admitting: Psychiatry

## 2012-03-15 ENCOUNTER — Encounter (HOSPITAL_COMMUNITY): Payer: Self-pay | Admitting: Psychiatry

## 2012-03-15 VITALS — BP 112/72 | Ht 64.0 in | Wt 124.0 lb

## 2012-03-15 DIAGNOSIS — F909 Attention-deficit hyperactivity disorder, unspecified type: Secondary | ICD-10-CM

## 2012-03-15 DIAGNOSIS — F329 Major depressive disorder, single episode, unspecified: Secondary | ICD-10-CM

## 2012-03-15 MED ORDER — LISDEXAMFETAMINE DIMESYLATE 70 MG PO CAPS: 70.0000 mg | ORAL_CAPSULE | ORAL | Status: DC

## 2012-03-15 NOTE — Progress Notes (Signed)
   [facility] Follow-up Outpatient Visit  Alicia Aguilar 05-03-1946   Subjective: The patient is a 66 year old female who has not been followed by [facility] September of 2008. 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 At her last appointment, I did not make any changes. The patient reports a low mood today. She feels like she has no motivation. She states that she cannot find anything in her house. She has been sleeping to avoid doing anything. She's been down a lot. She's not really interacting. She spends a lot of time with her 2 dogs. She did have a crying spell one day last week. She feels like her focus is okay, she just is not motivated. She says she's always been the type that either wants everything super organized, or let it slide.  Filed Vitals:   03/15/12 1510  BP: 112/72    Mental Status Examination  Appearance: Neatly dressed Alert: Yes Attention: good  Cooperative: Yes Eye Contact: Good Speech: Normal rate rhythm and volume Psychomotor Activity: Normal Memory/Concentration: Intact Oriented: person, place, time/date and situation Mood: Euthymic Affect: Full Range Thought Processes and Associations: Logical Fund of Knowledge: Good Thought Content: No suicidal homicidal thoughts Insight: Fair Judgement: Fair  Diagnosis: Maj. depressive disorder recurrent moderate, ADHD inattentive type  Treatment Plan: We will not make any changes today. I will see the patient back in 2 months.  Jamse Mead, MD

## 2012-03-27 ENCOUNTER — Other Ambulatory Visit: Payer: Self-pay | Admitting: Family Medicine

## 2012-03-29 ENCOUNTER — Encounter: Payer: Self-pay | Admitting: Family Medicine

## 2012-03-31 ENCOUNTER — Encounter: Payer: Self-pay | Admitting: Family Medicine

## 2012-03-31 ENCOUNTER — Ambulatory Visit (INDEPENDENT_AMBULATORY_CARE_PROVIDER_SITE_OTHER): Payer: Medicare Other | Admitting: Family Medicine

## 2012-03-31 VITALS — BP 131/89 | HR 95 | Ht 63.0 in | Wt 127.0 lb

## 2012-03-31 DIAGNOSIS — N951 Menopausal and female climacteric states: Secondary | ICD-10-CM

## 2012-03-31 DIAGNOSIS — E785 Hyperlipidemia, unspecified: Secondary | ICD-10-CM

## 2012-03-31 DIAGNOSIS — M4800 Spinal stenosis, site unspecified: Secondary | ICD-10-CM | POA: Insufficient documentation

## 2012-03-31 DIAGNOSIS — Z23 Encounter for immunization: Secondary | ICD-10-CM

## 2012-03-31 DIAGNOSIS — R011 Cardiac murmur, unspecified: Secondary | ICD-10-CM | POA: Insufficient documentation

## 2012-03-31 DIAGNOSIS — R42 Dizziness and giddiness: Secondary | ICD-10-CM

## 2012-03-31 DIAGNOSIS — Z1211 Encounter for screening for malignant neoplasm of colon: Secondary | ICD-10-CM

## 2012-03-31 DIAGNOSIS — Z1231 Encounter for screening mammogram for malignant neoplasm of breast: Secondary | ICD-10-CM

## 2012-03-31 DIAGNOSIS — Z0000 Encounter for general adult medical examination without abnormal findings: Secondary | ICD-10-CM

## 2012-03-31 MED ORDER — AMBULATORY NON FORMULARY MEDICATION: Status: DC

## 2012-03-31 NOTE — Progress Notes (Signed)
Subjective:    Alicia Aguilar is a 66 y.o. female who presents for Medicare Initial preventive examination.  She does complain of feeling off balance for several weeks. She has been gradual. Sometimes it happens when she turns her head. Sometimes it happens when she stands up or bends over to get something. She denies any ear pain or pressure or fever. She denies any recent changes in her medications. She does have a history spinal stenosis and has numbness in her left leg. She does have chronic hearing but has not changed and she denies any recent hearing loss.She is smoker and has been SOB with some acitivity. Still works in her yard but no regimented exercise.   Preventive Screening-Counseling & Management  Tobacco History  Smoking status  . Current Everyday Smoker -- 1.0 packs/day for 20 years  . Types: Cigarettes  Smokeless tobacco  . Not on file     Problems Prior to Visit 1.   Current Problems (verified) Patient Active Problem List  Diagnosis  . TOBACCO ABUSE  . MDD (major depressive disorder)  . GERD  . DISC DISEASE, LUMBAR  . LIMB PAIN  . HYPERLIPIDEMIA  . SHOULDER PAIN, RIGHT  . Hypertension  . ACUTE SINUSITIS, UNSPECIFIED    Medications Prior to Visit Current Outpatient Prescriptions on File Prior to Visit  Medication Sig Dispense Refill  . aspirin 81 MG tablet Take 81 mg by mouth daily.        Marland Kitchen buPROPion (WELLBUTRIN XL) 300 MG 24 hr tablet Take 1 tablet (300 mg total) by mouth daily.  90 tablet  4  . fluticasone (FLONASE) 50 MCG/ACT nasal spray Place 2 sprays into the nose daily.  16 g  12  . ibuprofen (ADVIL,MOTRIN) 200 MG tablet Take 200 mg by mouth every 8 (eight) hours as needed.        Marland Kitchen lisdexamfetamine (VYVANSE) 70 MG capsule Take 1 capsule (70 mg total) by mouth every morning.  90 capsule  0  . LORazepam (ATIVAN) 0.5 MG tablet Take 0.5 mg by mouth every 8 (eight) hours as needed.        . Multiple Vitamin (MULTIVITAMIN) tablet Take 1 tablet by mouth  daily.        Marland Kitchen omeprazole (PRILOSEC) 20 MG capsule Take 20 mg by mouth daily.        . ranitidine (ZANTAC) 150 MG tablet Take 150 mg by mouth at bedtime.        . sertraline (ZOLOFT) 100 MG tablet Take by mouth. Take 2 tabs by mouth once daily.       . simvastatin (ZOCOR) 80 MG tablet TAKE 1 TABLET BY MOUTH EVERY NIGHT AT BEDTIME  30 tablet  0  . triamterene-hydrochlorothiazide (DYAZIDE) 37.5-25 MG per capsule TAKE 1 CAPSULE MONDAY, WEDNESDAY, AND FRIDAY  90 capsule  2  . fish oil-omega-3 fatty acids 1000 MG capsule Take 1,200 mg by mouth 2 (two) times daily.          Current Medications (verified) Current Outpatient Prescriptions  Medication Sig Dispense Refill  . aspirin 81 MG tablet Take 81 mg by mouth daily.        Marland Kitchen buPROPion (WELLBUTRIN XL) 300 MG 24 hr tablet Take 1 tablet (300 mg total) by mouth daily.  90 tablet  4  . fluticasone (FLONASE) 50 MCG/ACT nasal spray Place 2 sprays into the nose daily.  16 g  12  . ibuprofen (ADVIL,MOTRIN) 200 MG tablet Take 200 mg by mouth every 8 (  eight) hours as needed.        Marland Kitchen lisdexamfetamine (VYVANSE) 70 MG capsule Take 1 capsule (70 mg total) by mouth every morning.  90 capsule  0  . LORazepam (ATIVAN) 0.5 MG tablet Take 0.5 mg by mouth every 8 (eight) hours as needed.        . Multiple Vitamin (MULTIVITAMIN) tablet Take 1 tablet by mouth daily.        Marland Kitchen omeprazole (PRILOSEC) 20 MG capsule Take 20 mg by mouth daily.        . ranitidine (ZANTAC) 150 MG tablet Take 150 mg by mouth at bedtime.        . sertraline (ZOLOFT) 100 MG tablet Take by mouth. Take 2 tabs by mouth once daily.       . simvastatin (ZOCOR) 80 MG tablet TAKE 1 TABLET BY MOUTH EVERY NIGHT AT BEDTIME  30 tablet  0  . triamterene-hydrochlorothiazide (DYAZIDE) 37.5-25 MG per capsule TAKE 1 CAPSULE MONDAY, WEDNESDAY, AND FRIDAY  90 capsule  2  . fish oil-omega-3 fatty acids 1000 MG capsule Take 1,200 mg by mouth 2 (two) times daily.           Allergies (verified) Erythromycin    PAST HISTORY  Family History Family History  Problem Relation Age of Onset  . Parkinsonism      family history  . Diabetes      family history  . Hypertension      family history  . Heart block      family history/cardiovascular disease  . Diabetes Mother   . Coronary artery disease Mother     CABG  . Dementia Father   . Coronary artery disease Sister     Social History History  Substance Use Topics  . Smoking status: Current Everyday Smoker -- 1.0 packs/day for 20 years    Types: Cigarettes  . Smokeless tobacco: Not on file  . Alcohol Use: Yes     socially     Are there smokers in your home (other than you)? Yes  Risk Factors Current exercise habits: Home exercise routine includes working in the garden. .  Dietary issues discussed: healthy   Cardiac risk factors: advanced age (older than 40 for men, 52 for women), sedentary lifestyle and smoking/ tobacco exposure.  Depression Screen (Note: if answer to either of the following is "Yes", a more complete depression screening is indicated)   Over the past 2 weeks, have you felt down, depressed or hopeless? No  Over the past 2 weeks, have you felt little interest or pleasure in doing things? No  Have you lost interest or pleasure in daily life? No  Do you often feel hopeless? No  Do you cry easily over simple problems? No  Activities of Daily Living In your present state of health, do you have any difficulty performing the following activities?:  Driving? No Managing money?  No Feeding yourself? No Getting from bed to chair? No Climbing a flight of stairs? No Preparing food and eating?: No Bathing or showering? No Getting dressed: No Getting to the toilet? No Using the toilet:No Moving around from place to place: No In the past year have you fallen or had a near fall?:No   Are you sexually active?  No  Do you have more than one partner?  No  Hearing Difficulties: No Do you often ask people to speak up  or repeat themselves? No Do you experience ringing or noises in your ears? Yes Do you  have difficulty understanding soft or whispered voices? No   Do you feel that you have a problem with memory? No  Do you often misplace items? No  Do you feel safe at home?  Yes  Cognitive Testing  Alert? Yes  Normal Appearance?Yes  Oriented to person? Yes  Place? Yes   Time? Yes  Recall of three objects?  Yes  Can perform simple calculations? Yes  Displays appropriate judgment?Yes  Can read the correct time from a watch face?Yes   Advanced Directives have been discussed with the patient? Yes  List the Names of Other Physician/Practitioners you currently use: 1.  Dr. Epimenio Foot  Indicate any recent Medical Services you may have received from other than [facility] providers in the past year (date may be approximate).  Immunization History  Administered Date(s) Administered  . Influenza Split 08/12/2011  . Influenza Whole 10/18/2004  . Pneumococcal Polysaccharide 10/18/2004  . Td 10/18/1996, 02/10/2010    Screening Tests Health Maintenance  Topic Date Due  . Mammogram  04/09/1996  . Colonoscopy  04/09/1996  . Zostavax  04/09/2006  . Pneumococcal Polysaccharide Vaccine Age 19 And Over  04/10/2011  . Influenza Vaccine  07/18/2012  . Tetanus/tdap  02/11/2020    All answers were reviewed with the patient and necessary referrals were made:  Val Schiavo, MD   03/31/2012   History reviewed: allergies, current medications, past family history, past medical history, past social history, past surgical history and problem list  Review of Systems A comprehensive review of systems was negative.    Objective:     Vision by Snellen chart: right eye:20/40, left eye:20/30  Body mass index is 22.50 kg/(m^2). BP 131/89  Pulse 95  Ht 5\' 3"  (1.6 m)  Wt 127 lb (57.607 kg)  BMI 22.50 kg/m2  SpO2 98%  BP 131/89  Pulse 95  Ht 5\' 3"  (1.6 m)  Wt 127 lb (57.607 kg)  BMI 22.50 kg/m2  SpO2  98%  General Appearance:    Alert, cooperative, no distress, appears stated age  Head:    Normocephalic, without obvious abnormality, atraumatic  Eyes:    PERRL, conjunctiva/corneas clear, EOM's intact,both eyes  Ears:    Normal TM's and external ear canals, both ears  Nose:   Nares normal, septum midline, mucosa normal, no drainage    or sinus tenderness.  Throat:   Lips, mucosa, and tongue normal; teeth and gums normal  Neck:   Supple, symmetrical, trachea midline, no adenopathy;    thyroid:  no enlargement/tenderness/nodules; no carotid   bruit or JVD.  Back:     Symmetric, no curvature, ROM normal, no CVA tenderness  Lungs:     Clear to auscultation bilaterally, respirations unlabored  Chest Wall:    No tenderness or deformity   Heart:    Regular rate and rhythm, S1 and S2 normal, 3/6 SEM radiating into the left carotid,  murmur, rub   or gallop  Breast Exam:    No tenderness, masses, or nipple abnormality  Abdomen:     Soft, non-tender, bowel sounds active all four quadrants,    no masses, no organomegaly  Genitalia:    Normal female without lesion, discharge or tenderness  Rectal:    Normal tone, normal prostate, no masses or tenderness;   guaiac negative stool  Extremities:   Extremities normal, atraumatic, no cyanosis or edema  Pulses:   2+ and symmetric all extremities  Skin:   Skin color, texture, turgor normal, no rashes or lesions  Lymph  nodes:   Cervical, supraclavicular, and axillary nodes normal  Neurologic:   CNII-XII intact, normal strength, sensation and reflexes    throughout       Assessment:     Annual wellness exam      Plan:     During the course of the visit the patient was educated and counseled about appropriate screening and preventive services including:    Pneumococcal vaccine   Bone densitometry screening  Colorectal cancer screening  Smoking cessation counseling  shingles vaccine  Smoking cessation, given H.O.  Heart murmur -  Schedule for Echo and carotid dopplers.   Dizziness - see above.  Encouraged her to get up to date eye exam.   Diet review for nutrition referral? Yes ____  Not Indicated __x_   Patient Instructions (the written plan) was given to the patient.  Medicare Attestation I have personally reviewed: The patient's medical and social history Their use of alcohol, tobacco or illicit drugs Their current medications and supplements The patient's functional ability including ADLs,fall risks, home safety risks, cognitive, and hearing and visual impairment Diet and physical activities Evidence for depression or mood disorders  The patient's weight, height, BMI, and visual acuity have been recorded in the chart.  I have made referrals, counseling, and provided education to the patient based on review of the above and I have provided the patient with a written personalized care plan for preventive services.     Kea Callan, MD   03/31/2012

## 2012-03-31 NOTE — Patient Instructions (Addendum)
Get your shingles vaccine and local pharmacy. He can go over your lab work anytime. Please fast  9 hours before you go. Please schedule spirometry, a breathing test, here in the office for sometime this summer. We will call you as we get to set up for the echocardiogram and carotid Dopplers. If you have not heard from [place] in a week to please call us.   Smoking Cessation, Tips for Success YOU CAN QUIT SMOKING If you are ready to quit smoking, congratulations! You have chosen to help yourself be healthier. Cigarettes bring nicotine, tar, carbon monoxide, and other irritants into your body. Your lungs, heart, and blood vessels will be able to work better without these poisons. There are many different ways to quit smoking. Nicotine gum, nicotine patches, a nicotine inhaler, or nicotine nasal spray can help with physical craving. Hypnosis, support groups, and medicines help break the habit of smoking. Here are some tips to help you quit for good.  Throw away all cigarettes.   Clean and remove all ashtrays from your home, work, and car.   On a card, write down your reasons for quitting. Carry the card with you and read it when you get the urge to smoke.   Cleanse your body of nicotine. Drink enough water and fluids to keep your urine clear or pale yellow. Do this after quitting to flush the nicotine from your body.   Learn to predict your moods. Do not let a bad situation be your excuse to have a cigarette. Some situations in your life might tempt you into wanting a cigarette.   Never have "just one" cigarette. It leads to wanting another and another. Remind yourself of your decision to quit.   Change habits associated with smoking. If you smoked while driving or when feeling stressed, try other activities to replace smoking. Stand up when drinking your coffee. Brush your teeth after eating. Sit in a different chair when you read the paper. Avoid alcohol while trying to quit, and try to drink fewer  caffeinated beverages. Alcohol and caffeine may urge you to smoke.   Avoid foods and drinks that can trigger a desire to smoke, such as sugary or spicy foods and alcohol.   Ask people who smoke not to smoke around you.   Have something planned to do right after eating or having a cup of coffee. Take a walk or exercise to perk you up. This will help to keep you from overeating.   Try a relaxation exercise to calm you down and decrease your stress. Remember, you may be tense and nervous for the first 2 weeks after you quit, but this will pass.   Find new activities to keep your hands busy. Play with a pen, coin, or rubber band. Doodle or draw things on paper.   Brush your teeth right after eating. This will help cut down on the craving for the taste of tobacco after meals. You can try mouthwash, too.   Use oral substitutes, such as lemon drops, carrots, a cinnamon stick, or chewing gum, in place of cigarettes. Keep them handy so they are available when you have the urge to smoke.   When you have the urge to smoke, try deep breathing.   Designate your home as a nonsmoking area.   If you are a heavy smoker, ask your caregiver about a prescription for nicotine chewing gum. It can ease your withdrawal from nicotine.   Reward yourself. Set aside the cigarette money you save  and buy yourself something nice.   Look for support from others. Join a support group or smoking cessation program. Ask someone at home or at work to help you with your plan to quit smoking.   Always ask yourself, "Do I need this cigarette or is this just a reflex?" Tell yourself, "Today, I choose not to smoke," or "I do not want to smoke." You are reminding yourself of your decision to quit, even if you do smoke a cigarette.  HOW WILL I FEEL WHEN I QUIT SMOKING?  The benefits of not smoking start within days of quitting.   You may have symptoms of withdrawal because your body is used to nicotine (the addictive substance  in cigarettes). You may crave cigarettes, be irritable, feel very hungry, cough often, get headaches, or have difficulty concentrating.   The withdrawal symptoms are only temporary. They are strongest when you first quit but will go away within 10 to 14 days.   When withdrawal symptoms occur, stay in control. Think about your reasons for quitting. Remind yourself that these are signs that your body is healing and getting used to being without cigarettes.   Remember that withdrawal symptoms are easier to treat than the major diseases that smoking can cause.   Even after the withdrawal is over, expect periodic urges to smoke. However, these cravings are generally short-lived and will go away whether you smoke or not. Do not smoke!   If you relapse and smoke again, do not lose hope. Most smokers quit 3 times before they are successful.   If you relapse, do not give up! Plan ahead and think about what you will do the next time you get the urge to smoke.  LIFE AS A NONSMOKER: MAKE IT FOR A MONTH, MAKE IT FOR LIFE Day 1: Hang this page where you will see it every day. Day 2: Get rid of all ashtrays, matches, and lighters. Day 3: Drink water. Breathe deeply between sips. Day 4: Avoid places with smoke-filled air, such as bars, clubs, or the smoking section of restaurants. Day 5: Keep track of how much money you save by not smoking. Day 6: Avoid boredom. Keep a good book with you or go to the movies. Day 7: Reward yourself! One week without smoking! Day 8: Make a dental appointment to get your teeth cleaned. Day 9: Decide how you will turn down a cigarette before it is offered to you. Day 10: Review your reasons for quitting. Day 11: Distract yourself. Stay active to keep your mind off smoking and to relieve tension. Take a walk, exercise, read a book, do a crossword puzzle, or try a new hobby. Day 12: Exercise. Get off the bus before your stop or use stairs instead of escalators. Day 13: Call on  friends for support and encouragement. Day 14: Reward yourself! Two weeks without smoking! Day 15: Practice deep breathing exercises. Day 16: Bet a friend that you can stay a nonsmoker. Day 17: Ask to sit in nonsmoking sections of restaurants. Day 18: Hang up "No Smoking" signs. Day 19: Think of yourself as a nonsmoker. Day 20: Each morning, tell yourself you will not smoke. Day 21: Reward yourself! Three weeks without smoking! Day 22: Think of smoking in negative ways. Remember how it stains your teeth, gives you bad breath, and leaves you short of breath. Day 23: Eat a nutritious breakfast. Day 24:Do not relive your days as a smoker. Day 25: Hold a pencil in your hand when  talking on the telephone. Day 26: Tell all your friends you do not smoke. Day 27: Think about how much better food tastes. Day 28: Remember, one cigarette is one too many. Day 29: Take up a hobby that will keep your hands busy. Day 30: Congratulations! One month without smoking! Give yourself a big reward. Your caregiver can direct you to community resources or hospitals for support, which may include:  Group support.   Education.   Hypnosis.   Subliminal therapy.  Document Released: 07/02/2004 Document Revised: 09/23/2011 Document Reviewed: 07/21/2009 [facility] Patient Information 2012 [place], [place].

## 2012-04-06 ENCOUNTER — Ambulatory Visit (HOSPITAL_BASED_OUTPATIENT_CLINIC_OR_DEPARTMENT_OTHER)
Admission: RE | Admit: 2012-04-06 | Discharge: 2012-04-06 | Disposition: A | Discharge status: Home / Self Care | Payer: Medicare Other | Source: Ambulatory Visit | Attending: Family Medicine | Admitting: Family Medicine

## 2012-04-06 DIAGNOSIS — R011 Cardiac murmur, unspecified: Secondary | ICD-10-CM | POA: Insufficient documentation

## 2012-04-06 DIAGNOSIS — R42 Dizziness and giddiness: Secondary | ICD-10-CM | POA: Insufficient documentation

## 2012-04-06 DIAGNOSIS — I6529 Occlusion and stenosis of unspecified carotid artery: Secondary | ICD-10-CM | POA: Insufficient documentation

## 2012-04-06 DIAGNOSIS — I658 Occlusion and stenosis of other precerebral arteries: Secondary | ICD-10-CM | POA: Insufficient documentation

## 2012-04-06 DIAGNOSIS — F17200 Nicotine dependence, unspecified, uncomplicated: Secondary | ICD-10-CM | POA: Insufficient documentation

## 2012-04-10 LAB — CBC WITH DIFFERENTIAL/PLATELET
HCT: 37.5 % (ref 36.0–46.0)
Hemoglobin: 12.7 g/dL (ref 12.0–15.0)
Lymphocytes Relative: 32 % (ref 12–46)
Monocytes Absolute: 0.3 10*3/uL (ref 0.1–1.0)
Monocytes Relative: 4 % (ref 3–12)
Neutro Abs: 4.9 10*3/uL (ref 1.7–7.7)
RBC: 4.44 MIL/uL (ref 3.87–5.11)
WBC: 8.2 10*3/uL (ref 4.0–10.5)

## 2012-04-11 LAB — COMPLETE METABOLIC PANEL WITH GFR
Alkaline Phosphatase: 88 U/L (ref 39–117)
BUN: 28 mg/dL — ABNORMAL HIGH (ref 6–23)
Creat: 0.85 mg/dL (ref 0.50–1.10)
GFR, Est Non African American: 72 mL/min
Glucose, Bld: 106 mg/dL — ABNORMAL HIGH (ref 70–99)
Sodium: 142 mEq/L (ref 135–145)
Total Bilirubin: 0.3 mg/dL (ref 0.3–1.2)

## 2012-04-11 LAB — LIPID PANEL
Cholesterol: 198 mg/dL (ref 0–200)
HDL: 60 mg/dL (ref >39)
Total CHOL/HDL Ratio: 3.3 Ratio
Triglycerides: 181 mg/dL — ABNORMAL HIGH (ref <150)

## 2012-04-12 ENCOUNTER — Ambulatory Visit (HOSPITAL_BASED_OUTPATIENT_CLINIC_OR_DEPARTMENT_OTHER)
Admission: RE | Admit: 2012-04-12 | Discharge: 2012-04-12 | Disposition: A | Discharge status: Home / Self Care | Payer: Medicare Other | Source: Ambulatory Visit | Attending: Family Medicine | Admitting: Family Medicine

## 2012-04-12 DIAGNOSIS — M25519 Pain in unspecified shoulder: Secondary | ICD-10-CM | POA: Insufficient documentation

## 2012-04-12 DIAGNOSIS — F329 Major depressive disorder, single episode, unspecified: Secondary | ICD-10-CM | POA: Insufficient documentation

## 2012-04-12 DIAGNOSIS — M4800 Spinal stenosis, site unspecified: Secondary | ICD-10-CM | POA: Insufficient documentation

## 2012-04-12 DIAGNOSIS — I059 Rheumatic mitral valve disease, unspecified: Secondary | ICD-10-CM | POA: Insufficient documentation

## 2012-04-12 DIAGNOSIS — M5137 Other intervertebral disc degeneration, lumbosacral region: Secondary | ICD-10-CM | POA: Insufficient documentation

## 2012-04-12 DIAGNOSIS — I10 Essential (primary) hypertension: Secondary | ICD-10-CM | POA: Insufficient documentation

## 2012-04-12 DIAGNOSIS — R42 Dizziness and giddiness: Secondary | ICD-10-CM

## 2012-04-12 DIAGNOSIS — I359 Nonrheumatic aortic valve disorder, unspecified: Secondary | ICD-10-CM | POA: Insufficient documentation

## 2012-04-12 DIAGNOSIS — K219 Gastro-esophageal reflux disease without esophagitis: Secondary | ICD-10-CM | POA: Insufficient documentation

## 2012-04-12 DIAGNOSIS — R011 Cardiac murmur, unspecified: Secondary | ICD-10-CM

## 2012-04-12 DIAGNOSIS — M51379 Other intervertebral disc degeneration, lumbosacral region without mention of lumbar back pain or lower extremity pain: Secondary | ICD-10-CM | POA: Insufficient documentation

## 2012-04-12 NOTE — Progress Notes (Signed)
  Echocardiogram 2D Echocardiogram has been performed.  [name] 04/12/2012, 9:31 AM

## 2012-04-18 ENCOUNTER — Other Ambulatory Visit: Payer: Self-pay

## 2012-04-18 ENCOUNTER — Other Ambulatory Visit: Payer: Self-pay | Admitting: Family Medicine

## 2012-04-18 ENCOUNTER — Ambulatory Visit (INDEPENDENT_AMBULATORY_CARE_PROVIDER_SITE_OTHER): Payer: Medicare Other

## 2012-04-18 ENCOUNTER — Ambulatory Visit: Payer: Self-pay

## 2012-04-18 DIAGNOSIS — Z1231 Encounter for screening mammogram for malignant neoplasm of breast: Secondary | ICD-10-CM

## 2012-04-18 DIAGNOSIS — M810 Age-related osteoporosis without current pathological fracture: Secondary | ICD-10-CM | POA: Insufficient documentation

## 2012-04-18 DIAGNOSIS — N951 Menopausal and female climacteric states: Secondary | ICD-10-CM

## 2012-04-19 ENCOUNTER — Other Ambulatory Visit: Payer: Self-pay | Admitting: Family Medicine

## 2012-04-19 ENCOUNTER — Telehealth: Payer: Self-pay | Admitting: *Deleted

## 2012-04-19 MED ORDER — ALENDRONATE SODIUM 70 MG PO TABS: 70.0000 mg | ORAL_TABLET | ORAL | Status: DC

## 2012-04-21 ENCOUNTER — Other Ambulatory Visit (HOSPITAL_COMMUNITY): Payer: Self-pay

## 2012-04-21 MED ORDER — LISDEXAMFETAMINE DIMESYLATE 70 MG PO CAPS: 70.0000 mg | ORAL_CAPSULE | ORAL | Status: DC

## 2012-04-21 NOTE — Telephone Encounter (Signed)
ok 

## 2012-05-01 ENCOUNTER — Ambulatory Visit (INDEPENDENT_AMBULATORY_CARE_PROVIDER_SITE_OTHER): Payer: Medicare Other | Admitting: Family Medicine

## 2012-05-01 ENCOUNTER — Encounter: Payer: Self-pay | Admitting: Family Medicine

## 2012-05-01 VITALS — BP 139/82 | HR 91 | Wt 126.0 lb

## 2012-05-01 DIAGNOSIS — F17200 Nicotine dependence, unspecified, uncomplicated: Secondary | ICD-10-CM

## 2012-05-01 DIAGNOSIS — I10 Essential (primary) hypertension: Secondary | ICD-10-CM

## 2012-05-01 DIAGNOSIS — J4489 Other specified chronic obstructive pulmonary disease: Secondary | ICD-10-CM

## 2012-05-01 DIAGNOSIS — R0602 Shortness of breath: Secondary | ICD-10-CM

## 2012-05-01 DIAGNOSIS — J449 Chronic obstructive pulmonary disease, unspecified: Secondary | ICD-10-CM

## 2012-05-01 DIAGNOSIS — Z720 Tobacco use: Secondary | ICD-10-CM

## 2012-05-01 LAB — PULMONARY FUNCTION TEST

## 2012-05-01 MED ORDER — LISINOPRIL-HYDROCHLOROTHIAZIDE 10-12.5 MG PO TABS: 1.0000 | ORAL_TABLET | Freq: Every day | ORAL | Status: DC

## 2012-05-01 MED ORDER — ALBUTEROL SULFATE HFA 108 (90 BASE) MCG/ACT IN AERS: 2.0000 | INHALATION_SPRAY | Freq: Four times a day (QID) | RESPIRATORY_TRACT | Status: DC | PRN

## 2012-05-01 MED ORDER — ALBUTEROL SULFATE (5 MG/ML) 0.5% IN NEBU: 2.5000 mg | INHALATION_SOLUTION | Freq: Once | RESPIRATORY_TRACT | Status: DC

## 2012-05-01 NOTE — Assessment & Plan Note (Signed)
Spirometry performed on 05/01/2012. FVC was 98%. FEV1 86% and ratio of 70%. Mild COPD. we discussed preventive therapy including quitting smoking and staying away from strong smells and perfumes that may trigger shortness of breath. I do encourage regular exercise and walking for activity level as much she can tolerate with her spinal stenosis. We also discussed getting yearly flu vaccines. Also give her prescription for a albuterol inhaler to use as needed for shortness of breath. Continue to work on smoking cessation with 30 cigarettes.

## 2012-05-01 NOTE — Progress Notes (Addendum)
  Subjective:    Patient ID: Alicia Aguilar, female    DOB: 03-11-1946, 66 y.o.   MRN: [id number]  HPI HTN - No CP or SOB. Taking her fluid pill every other day.  REcently BUN was elevated. She denies any excess dry mouth. She does occasionally feel dizzy if she bends over and then stands up too quickly.  SOB - Says only time notices SOB is when chases her dog to bring him inside.  Here for spirometry testing to eval for COPD  Tob abuse- She is now trying e-cigs.  Had quit before on Chantix before started pscyh meds.     Review of Systems     Objective:   Physical Exam  Constitutional: She is oriented to person, place, and time. She appears well-developed and well-nourished.  HENT:  Head: Normocephalic and atraumatic.  Cardiovascular: Normal rate, regular rhythm and normal heart sounds.   Pulmonary/Chest: Effort normal and breath sounds normal.  Neurological: She is alert and oriented to person, place, and time.  Skin: Skin is warm and dry.  Psychiatric: She has a normal mood and affect. Her behavior is normal.          Assessment & Plan:  HTN - Fair control. Will stop the triamterene HCTZ and start lisinopril HCTZ that she can take daily. I recommend check BUN and creatinine 1-2 weeks after she gets her new prescription. Followup with me in 6 weeks for blood pressure check.  Tob abuse - Continue to work on smoking cessation.  Using e - cig. Discused using 1800-QUIT-NOW.

## 2012-05-01 NOTE — Patient Instructions (Signed)
Stop triamterene hctz Start the lisinopril hctz Check labwork in 1-2 weeks.

## 2012-05-05 ENCOUNTER — Other Ambulatory Visit: Payer: Self-pay | Admitting: Family Medicine

## 2012-05-09 ENCOUNTER — Telehealth: Payer: Self-pay | Admitting: *Deleted

## 2012-05-09 NOTE — Telephone Encounter (Signed)
Ok, make sure taking her calcium with vit D daily. We can consider evsita instead which is a totally different category of medication. It does help with prevention and treatment of osteoporosis but there is a slight increased risk of embolus and stroke but it is very low. If she think she would like to try this and let me know how can send it to her pharmacy. Please add the Fosamax and alendronate to the allergy list and mark as an intolerance.

## 2012-05-09 NOTE — Telephone Encounter (Signed)
Pt calls and states that she can not take the fosomax because she has Alicia Aguilar and on prilosec and zantac daily and this aggravated the reflux

## 2012-05-09 NOTE — Telephone Encounter (Signed)
Pt notified and she will think about the Evista and let you know her decision. KG LPN

## 2012-05-09 NOTE — Telephone Encounter (Signed)
Pt states she is unable to take Alendronate b/c it causes diarrhea and vomiting. States she won't be taking it anymore. Please advise.

## 2012-05-10 ENCOUNTER — Telehealth: Payer: Self-pay | Admitting: *Deleted

## 2012-05-10 NOTE — Telephone Encounter (Signed)
There is a medication called Reclast which is given through an IV once a year. It works similar to the bisphosphonates which she has taken by mouth but it is given in IV form and it only given once a year. Certainly if she is interested we could do that. Often you do experience flulike symptoms for the first couple days after infusion but typically there is no other longer-lasting side effects. We would have to make sure kidney function etc. was was are normal.

## 2012-05-10 NOTE — Telephone Encounter (Signed)
Pt states that she seen on the internet about a medication for osteoporosis that is given 1-2 times a year. States she doesn't remember the name of it but would like to know if you think she would benefit from it. Please advise.

## 2012-05-10 NOTE — Telephone Encounter (Signed)
LM for pt to returncall

## 2012-05-11 MED ORDER — TRAMADOL HCL 50 MG PO TABS: 50.0000 mg | ORAL_TABLET | Freq: Two times a day (BID) | ORAL | Status: DC | PRN

## 2012-05-11 NOTE — Telephone Encounter (Signed)
Okay to call in tramadol 50 mg twice a day when necessary. #30 tabs. No refills. If she's not getting better in the next couple weeks then she needs to make a followup appointment for further evaluation.

## 2012-05-11 NOTE — Telephone Encounter (Signed)
LM for pt to returncall

## 2012-05-11 NOTE — Telephone Encounter (Signed)
Pt informed

## 2012-05-11 NOTE — Telephone Encounter (Signed)
Pt states that she is having pain from her buttock down RT leg. States you have mentioned about giving her some stronger than Ibuprofen. She would like to know if you can give her something to help with the pain. States if she needs to come in she will. States she will speak with her daughter about the Reclast and get back to you on that. Please advise.

## 2012-05-15 ENCOUNTER — Ambulatory Visit (HOSPITAL_COMMUNITY): Payer: Self-pay | Admitting: Psychiatry

## 2012-05-22 ENCOUNTER — Ambulatory Visit (HOSPITAL_COMMUNITY): Payer: Self-pay | Admitting: Psychiatry

## 2012-05-22 ENCOUNTER — Ambulatory Visit (INDEPENDENT_AMBULATORY_CARE_PROVIDER_SITE_OTHER): Payer: Medicare Other | Admitting: Family Medicine

## 2012-05-22 ENCOUNTER — Encounter: Payer: Self-pay | Admitting: Family Medicine

## 2012-05-22 VITALS — BP 128/81 | HR 101 | Wt 122.0 lb

## 2012-05-22 DIAGNOSIS — IMO0002 Reserved for concepts with insufficient information to code with codable children: Secondary | ICD-10-CM

## 2012-05-22 DIAGNOSIS — M5416 Radiculopathy, lumbar region: Secondary | ICD-10-CM

## 2012-05-22 MED ORDER — PREDNISONE 20 MG PO TABS: ORAL_TABLET | ORAL | Status: DC

## 2012-05-22 MED ORDER — CYCLOBENZAPRINE HCL 10 MG PO TABS: ORAL_TABLET | ORAL | Status: DC

## 2012-05-22 NOTE — Progress Notes (Signed)
CC: Alicia Aguilar is a 66 y.o. female is here for Back Pain   Subjective: HPI: Right low back/side pain that radiates through the right thigh and ends within the knee.  Described as an ache.  Present for weeks without any known inciting event.  Pain is a 5/10 during the encounter but at times can get "unbearble".  Pain usually comes on without warning and lasts hours.  Has tried ibuprophen 400mg , tramadol 50mg , ice & heat without much improvement.  Has been getting some improvement using a home exercise plan from back when she had a laminectomy in June 2011. Nothing seems to make it worse, it improves if she flexes her chest to her legs in a seated position.  She's unsure about weakness in the right leg but admits to some mild paraesthesia in the thigh which comes and goes.  Denies recent falls, accidents, trauma, bowel/bladder incontinence, muscle atrophy, coordination issues nor midline back pain.    June 2011: Bilateral T12-L1 laminectomy and transpedicular right T12-L1 microdiscectomy with Dr. Ferdinand Cava ([facility])  Review Of Systems Outlined In HPI  Past Medical History  Diagnosis Date  . 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  . Hyperlipidemia      Family History  Problem Relation Age of Onset  . Parkinsonism      family history  . Diabetes      family history  . Hypertension      family history  . Heart block      family history/cardiovascular disease  . Diabetes Mother   . Coronary artery disease Mother     CABG  . Dementia Father   . Coronary artery disease Sister      History  Substance Use Topics  . Smoking status: Current Everyday Smoker -- 1.0 packs/day for 20 years    Types: Cigarettes  . Smokeless tobacco: Not on file  . Alcohol Use: Yes     socially     Objective: Filed Vitals:   05/22/12 1013  BP: 128/81  Pulse: 101    General: Alert and Oriented, No Acute Distress Lungs:Comfortable work of breathing.    Back: No  midline tenderness, full Lumbar ROM.  Paraspinal muscle tenderness in the lower right lumbar region. Extremities: No peripheral edema.  Strong peripheral pulses. SLR negative, no pain with leg log roll.  Mild pain with FABIR, none with FADIR.  Full strength in all leg muscle groups.  L4 & S1 DTRs 2/4 bilaterally.  Gait normal. Mental Status: No depression, anxiety, nor agitation. Skin: Warm and dry, no skin changes on back.  Assessment & Plan: Legaci was seen today for back pain.  Diagnoses and associated orders for this visit:  Lumbar radiculopathy, acute - predniSONE (DELTASONE) 20 MG tablet; Take three tabs daily on days 1-3, two tabs daily on days 4-6, one tab daily on days 7-9, half a tab daily on days 10-13. - cyclobenzaprine (FLEXERIL) 10 MG tablet; Take a full to half a tablet before bed to help with back pain.  Other Orders - traMADol (ULTRAM) 50 MG tablet; Take 50 mg by mouth 2 (two) times daily as needed.    Printed low-back exercises/stretching and discussed using this as a HEP qd-bid for the next two weeks.  Return in one week if pain not improving while on prednisone, otherwise in one month to ensure proper resolution.  Trial of flexeril at bedtime, discussed sedative risk.    Return  in about 1 month (around 06/22/2012).  Requested Prescriptions   Signed Prescriptions Disp Refills  . predniSONE (DELTASONE) 20 MG tablet 20 tablet 0    Sig: Take three tabs daily on days 1-3, two tabs daily on days 4-6, one tab daily on days 7-9, half a tab daily on days 10-13.  . cyclobenzaprine (FLEXERIL) 10 MG tablet 45 tablet 0    Sig: Take a full to half a tablet before bed to help with back pain.

## 2012-05-22 NOTE — Patient Instructions (Signed)
Use exercise handouts daily.  Return if no improvement on prednisone for a week.

## 2012-05-23 LAB — BASIC METABOLIC PANEL WITH GFR
BUN: 26 mg/dL — ABNORMAL HIGH (ref 6–23)
CO2: 25 mEq/L (ref 19–32)
Chloride: 106 mEq/L (ref 96–112)
Glucose, Bld: 95 mg/dL (ref 70–99)
Potassium: 3.8 mEq/L (ref 3.5–5.3)

## 2012-05-24 ENCOUNTER — Encounter (HOSPITAL_COMMUNITY): Payer: Self-pay | Admitting: Psychiatry

## 2012-05-24 ENCOUNTER — Ambulatory Visit (INDEPENDENT_AMBULATORY_CARE_PROVIDER_SITE_OTHER): Payer: Medicare Other | Admitting: Psychiatry

## 2012-05-24 VITALS — BP 118/78 | Ht 64.0 in | Wt 123.0 lb

## 2012-05-24 DIAGNOSIS — F900 Attention-deficit hyperactivity disorder, predominantly inattentive type: Secondary | ICD-10-CM

## 2012-05-24 DIAGNOSIS — F331 Major depressive disorder, recurrent, moderate: Secondary | ICD-10-CM

## 2012-05-24 DIAGNOSIS — F988 Other specified behavioral and emotional disorders with onset usually occurring in childhood and adolescence: Secondary | ICD-10-CM

## 2012-05-24 DIAGNOSIS — F329 Major depressive disorder, single episode, unspecified: Secondary | ICD-10-CM

## 2012-05-24 MED ORDER — LORAZEPAM 0.5 MG PO TABS: 0.5000 mg | ORAL_TABLET | Freq: Every day | ORAL | Status: DC

## 2012-05-24 MED ORDER — LISDEXAMFETAMINE DIMESYLATE 70 MG PO CAPS: 70.0000 mg | ORAL_CAPSULE | ORAL | Status: DC

## 2012-05-24 NOTE — Progress Notes (Signed)
   [facility] Behavioral Health Follow-up Outpatient Visit  Alicia Aguilar 03/14/1946   Subjective: The patient is a 66 year old female who has not been followed by [facility] September of 2008. 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 Last appointment, she had a low mood with low motivation. She was sleeping a lot. I did not make any changes, but plan to check on her in 2 months. The patient has had multiple health problems. She has been diagnosed with sciatica. She was using tramadol, which did not help. She found the Ativan would help a little bit with the muscle spasms. She is currently been started on prednisone and a muscle relaxer. Prior to this, she was having issues with her balance. 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 An echo showed that her aortic valve had moderate regurgitation. She had a bone scan was diagnosed with osteoporosis. She went to see her eye doctor and has been diagnosed with cataracts, but they are not serious enough to operate on. She has also been diagnosed with COPD in is trying to quit smoking. With all this going on, she has not had time to focus on her depression. She feels that she is hanging in there.  Filed Vitals:   05/24/12 1215  BP: 118/78    Mental Status Examination  Appearance: Neatly dressed Alert: Yes Attention: good  Cooperative: Yes Eye Contact: Good Speech: Normal rate rhythm and volume Psychomotor Activity: Normal Memory/Concentration: Intact Oriented: person, place, time/date and situation Mood: Euthymic Affect: Full Range Thought Processes and Associations: Logical Fund of Knowledge: Good Thought Content: No suicidal homicidal thoughts Insight: Fair Judgement: Fair  Diagnosis: Maj. depressive disorder recurrent moderate, ADHD inattentive type  Treatment Plan: We will not make any changes today. I will see the patient back in 3 months. Patient to call with  concerns.  Jamse Mead, MD

## 2012-07-02 ENCOUNTER — Other Ambulatory Visit: Payer: Self-pay | Admitting: Family Medicine

## 2012-07-04 ENCOUNTER — Ambulatory Visit (INDEPENDENT_AMBULATORY_CARE_PROVIDER_SITE_OTHER): Payer: Medicare Other | Admitting: Family Medicine

## 2012-07-04 ENCOUNTER — Encounter: Payer: Self-pay | Admitting: Family Medicine

## 2012-07-04 VITALS — BP 114/74 | HR 95 | Wt 117.0 lb

## 2012-07-04 DIAGNOSIS — Z23 Encounter for immunization: Secondary | ICD-10-CM

## 2012-07-04 DIAGNOSIS — Z298 Encounter for other specified prophylactic measures: Secondary | ICD-10-CM

## 2012-07-04 DIAGNOSIS — R000 Tachycardia, unspecified: Secondary | ICD-10-CM

## 2012-07-04 NOTE — Progress Notes (Signed)
CC: Alicia Aguilar is a 66 y.o. female is here for Hypertension   Subjective: HPI:  Patient presents due to concerns of a constellation of symptoms including elevated heart rate at rest, heat intolerance, brittle hair and falling out, mild tremor, short fuse that seems to be getting worse over the past few months. She also mentions some mild unintentional weight loss. She's a family history of thyroid abnormalities and is concerned that the above symptoms may be contributed to her developing thyroid issues. There been no interventions as of yet. Her last TSH was 10 months ago and was at the lower limit of normal. She's not on any stimulants other than vyvanse, she does smoke, does no recreational drug use. She denies any irregular heart beat nor palpitations. There's been no chest pain, fevers, chills, shortness of breath, deterioration in cough, throat swelling, nor palpable masses of the neck. She admits to some hoarseness of her voice over the past year he attributes this to unsuccessful attempts at quitting smoking. She brings in a blood pressure cuff which shows great blood pressures in a normotensive range as pulses ranging between 98 and the upper one teens.   Review Of Systems Outlined In HPI  Past Medical History  Diagnosis Date  . 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  . Hyperlipidemia      Family History  Problem Relation Age of Onset  . Parkinsonism      family history  . Diabetes      family history  . Hypertension      family history  . Heart block      family history/cardiovascular disease  . Diabetes Mother   . Coronary artery disease Mother     CABG  . Dementia Father   . Coronary artery disease Sister      History  Substance Use Topics  . Smoking status: Current Every Day Smoker -- 1.0 packs/day for 20 years    Types: Cigarettes  . Smokeless tobacco: Not on file  . Alcohol Use: Yes     socially      Objective: Filed Vitals:   07/04/12 1556  BP: 114/74  Pulse: 95    General: Alert and Oriented, No Acute Distress HEENT: Pupils equal, round, reactive to light. Conjunctivae clear.  External ears unremarkable, canals clear with intact TMs with appropriate landmarks.    Moist mucous membranes, pharynx without inflammation nor lesions.  Neck supple without palpable lymphadenopathy nor abnormal masses. Lungs: Clear to auscultation bilaterally, no wheezing/ronchi/rales.  Comfortable work of breathing. Good air movement. Cardiac: Regular rate and rhythm. Normal S1/S2.  No murmurs, rubs, nor gallops.   Extremities: No peripheral edema.  Strong peripheral pulses.  Mental Status: No depression, anxiety, nor agitation. Skin: Warm and dry.  Assessment & Plan: Alicia Aguilar was seen today for hypertension.  Diagnoses and associated orders for this visit:  Need for prophylactic immunotherapy - Flu vaccine greater than or equal to 3yo preservative free IM  Tachycardia - Basic metabolic panel - TSH    Feeling EKG would be of low utility right now, her rhythm is in the low 90s during my exam when she's been sitting. We'll rule out electrolyte abnormalities and thyroid abnormalities with the above labs. Followup will be dictated based on the above results, no change in medications at this time, will consider ReFlex tachycardia with her blood pressure being relatively low.  Return wait for phone call tomorrow.  Requested Prescriptions    No prescriptions requested or ordered in this encounter

## 2012-07-05 LAB — BASIC METABOLIC PANEL
Potassium: 3 mEq/L — ABNORMAL LOW (ref 3.5–5.3)
Sodium: 138 mEq/L (ref 135–145)

## 2012-07-05 LAB — TSH: TSH: 1.15 u[IU]/mL (ref 0.350–4.500)

## 2012-07-10 ENCOUNTER — Ambulatory Visit (INDEPENDENT_AMBULATORY_CARE_PROVIDER_SITE_OTHER): Payer: Medicare Other | Admitting: Family Medicine

## 2012-07-10 ENCOUNTER — Encounter: Payer: Self-pay | Admitting: Family Medicine

## 2012-07-10 VITALS — BP 119/74 | HR 100 | Wt 115.0 lb

## 2012-07-10 DIAGNOSIS — R000 Tachycardia, unspecified: Secondary | ICD-10-CM

## 2012-07-10 LAB — POCT HEMOGLOBIN: Hemoglobin: 12.3 g/dL (ref 12.2–16.2)

## 2012-07-10 NOTE — Progress Notes (Signed)
CC: Alicia Aguilar is a 66 y.o. female is here for Follow-up   Subjective: HPI:  Patient presents for followup of tachycardia. At her last visit we checked a TSH which was normal and basic metabolic panel which showed a mild hypokalemia but no other abnormalities. She's been taking her pulse and blood pressure at home with a pulse on average in the low 110s while at rest prior to her last visit and she reports today that on average she's been more in the low 90s. She reports her blood pressure has been well-controlled without any hypotension readings nor subjective feelings of hypotension. She denies any chest pain, shortness of breath, regular heartbeat, palpitations, peripheral edema, nor orthopnea. She's been using E. cigarettes to quit smoking and has been on vyvanse for a matter of years.  Review Of Systems Outlined In HPI  Past Medical History  Diagnosis Date  . 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  . Hyperlipidemia      Family History  Problem Relation Age of Onset  . Parkinsonism      family history  . Diabetes      family history  . Hypertension      family history  . Heart block      family history/cardiovascular disease  . Diabetes Mother   . Coronary artery disease Mother     CABG  . Dementia Father   . Coronary artery disease Sister      History  Substance Use Topics  . Smoking status: Current Every Day Smoker -- 1.0 packs/day for 20 years    Types: Cigarettes  . Smokeless tobacco: Not on file  . Alcohol Use: Yes     socially     Objective: Filed Vitals:   07/10/12 1304  BP: 119/74  Pulse: 100    General: Alert and Oriented, No Acute Distress HEENT:Moist mucous membranes, pharynx without inflammation nor lesions.  Neck supple without palpable lymphadenopathy nor abnormal masses. Lungs: Clear to auscultation bilaterally, no wheezing/ronchi/rales.  Comfortable work of breathing. Good air movement. Cardiac:  Regular rate and rhythm. Normal S1/S2.  No murmurs, rubs, nor gallops.   Extremities: No peripheral edema.  Strong peripheral pulses.  Mental Status: No depression, anxiety, nor agitation. Skin: Warm and dry.  Assessment & Plan: Alicia Aguilar was seen today for follow-up.  Diagnoses and associated orders for this visit:  Tachycardia - PR ELECTROCARDIOGRAM, COMPLETE - POCT hemoglobin    EKG shows a normal sinus rhythm with normal intervals without pathology Q waves nor ST segment elevation or depression. Rate is at 90 beats per minute. Given that her heart rate has improved at home without any specific intervention and abnormalities and found a focus more on ensuring that she's well-hydrated a daily basis she describes drinking diet sodas more than water during a typical day. I also pointed out her mild hypokalemia and she prefer to manage this with having orange juice and bananas to her diet. Asked her to return if she has any irregular heartbeat or muscle aches. She has been asked to return if her heart rate is consistently above 100 when she monitors it at home. Fingerstick hemoglobin was 12 ruling out anemia as the cause of a rapid heartbeat.  No Follow-up on file.

## 2012-07-13 ENCOUNTER — Encounter: Payer: Self-pay | Admitting: *Deleted

## 2012-08-24 ENCOUNTER — Ambulatory Visit (HOSPITAL_COMMUNITY): Payer: Self-pay | Admitting: Psychiatry

## 2012-09-01 ENCOUNTER — Ambulatory Visit (INDEPENDENT_AMBULATORY_CARE_PROVIDER_SITE_OTHER): Payer: Medicare Other | Admitting: Psychiatry

## 2012-09-01 ENCOUNTER — Encounter (HOSPITAL_COMMUNITY): Payer: Self-pay | Admitting: Psychiatry

## 2012-09-01 VITALS — BP 108/72 | Ht 64.0 in | Wt 118.0 lb

## 2012-09-01 DIAGNOSIS — F331 Major depressive disorder, recurrent, moderate: Secondary | ICD-10-CM

## 2012-09-01 DIAGNOSIS — F900 Attention-deficit hyperactivity disorder, predominantly inattentive type: Secondary | ICD-10-CM

## 2012-09-01 DIAGNOSIS — F988 Other specified behavioral and emotional disorders with onset usually occurring in childhood and adolescence: Secondary | ICD-10-CM

## 2012-09-01 MED ORDER — LISDEXAMFETAMINE DIMESYLATE 70 MG PO CAPS: 70.0000 mg | ORAL_CAPSULE | ORAL | Status: DC

## 2012-09-01 NOTE — Progress Notes (Signed)
    [facility] Behavioral Health Follow-up Outpatient Visit  CASSEE THORNES 23-Apr-1946   Subjective: The patient is a 66 year old female who has not been followed by [facility] September of 2008. 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 At her last appointment, I did not make any changes. She presents today. She has been more irritable in the afternoon. She has been started on a new blood pressure medicine which is inducing tachycardia. She is being followed by the clinic upstairs. Her daughter has come a few times to help her clean up the house. Very concerned currently because she's had mice. There is still one left, and she cannot catch it. The patient feels like she's been low but more moody lately. She is concerned because she's going to have to go back to work. She's gone through her retirement money. She endorses good sleep. Weight is down 5 pounds today. She is somewhat disinhibited and today's appointment.  Filed Vitals:   09/01/12 1452  BP: 108/72    Mental Status Examination  Appearance: Neatly dressed Alert: Yes Attention: good  Cooperative: Yes Eye Contact: Good Speech: Normal rate rhythm and volume Psychomotor Activity: Normal Memory/Concentration: Intact Oriented: person, place, time/date and situation Mood: Euthymic Affect: Full Range Thought Processes and Associations: Logical Fund of Knowledge: Good Thought Content: No suicidal homicidal thoughts Insight: Fair Judgement: Fair  Diagnosis: Maj. depressive disorder recurrent moderate, ADHD inattentive type  Treatment Plan: We will not make any changes today. I will see the patient back in 3 months. Patient to call with concerns.  Jamse Mead, MD

## 2012-09-30 ENCOUNTER — Other Ambulatory Visit: Payer: Self-pay | Admitting: Family Medicine

## 2012-10-24 ENCOUNTER — Encounter: Payer: Self-pay | Admitting: Family Medicine

## 2012-10-24 ENCOUNTER — Ambulatory Visit (INDEPENDENT_AMBULATORY_CARE_PROVIDER_SITE_OTHER): Payer: Medicare Other | Admitting: Family Medicine

## 2012-10-24 VITALS — BP 109/72 | HR 105 | Resp 18 | Wt 121.0 lb

## 2012-10-24 DIAGNOSIS — I10 Essential (primary) hypertension: Secondary | ICD-10-CM

## 2012-10-24 DIAGNOSIS — R058 Other specified cough: Secondary | ICD-10-CM

## 2012-10-24 DIAGNOSIS — T464X5A Adverse effect of angiotensin-converting-enzyme inhibitors, initial encounter: Secondary | ICD-10-CM

## 2012-10-24 DIAGNOSIS — F17200 Nicotine dependence, unspecified, uncomplicated: Secondary | ICD-10-CM

## 2012-10-24 DIAGNOSIS — K219 Gastro-esophageal reflux disease without esophagitis: Secondary | ICD-10-CM

## 2012-10-24 DIAGNOSIS — R05 Cough: Secondary | ICD-10-CM

## 2012-10-24 MED ORDER — LOSARTAN POTASSIUM 25 MG PO TABS: 25.0000 mg | ORAL_TABLET | Freq: Every day | ORAL | Status: DC

## 2012-10-24 NOTE — Patient Instructions (Signed)
Increase your Prilosec to twice a day for a couple of week to get your symptoms under control and then go back down to once a day.

## 2012-10-24 NOTE — Progress Notes (Signed)
  Subjective:    Patient ID: Alicia Aguilar, female    DOB: [birth year], 67 y.o.   MRN: [id number]  HPI HTN - Has been coughing on the lisinopril since started it.  Says her mouth has been really dry. Thinks the med is too strong.  No chest pain or shortness of breath. She does feel that her reflux has been worse since starting the lisinopril as well. She currently takes Prilosec once a day. No other major changes. She is a smoker.  Review of Systems     Objective:   Physical Exam  Constitutional: She is oriented to person, place, and time. She appears well-developed and well-nourished.  HENT:  Head: Normocephalic and atraumatic.  Cardiovascular: Normal rate, regular rhythm and normal heart sounds.   Pulmonary/Chest: Effort normal and breath sounds normal.  Neurological: She is alert and oriented to person, place, and time.  Skin: Skin is warm and dry.  Psychiatric: She has a normal mood and affect. Her behavior is normal.          Assessment & Plan:  HTN- well controlled on current regimen. We'll stop the ACE inhibitor. I do think is the cause of her cough. Will change her to losartan. Followup in 6 weeks to recheck blood pressure.  Reflux-I. would like to try increasing her Prilosec to twice a day for the next 2 weeks and then get it down to once a day she's feeling better. She is a smoker which certainly contributes. I suspect from her recent coughing because of her medication has irritated her reflux.  Cough on ACEi. -Stop lisinopril.  Change to ARB. F/U in 6 weeks . Stop hctz.

## 2012-11-23 ENCOUNTER — Other Ambulatory Visit: Payer: Self-pay | Admitting: Family Medicine

## 2012-12-05 ENCOUNTER — Encounter (HOSPITAL_COMMUNITY): Payer: Self-pay | Admitting: Psychiatry

## 2012-12-05 ENCOUNTER — Ambulatory Visit (INDEPENDENT_AMBULATORY_CARE_PROVIDER_SITE_OTHER): Payer: Medicare Other | Admitting: Psychiatry

## 2012-12-05 VITALS — BP 102/78 | Ht 64.0 in | Wt 131.0 lb

## 2012-12-05 DIAGNOSIS — F900 Attention-deficit hyperactivity disorder, predominantly inattentive type: Secondary | ICD-10-CM

## 2012-12-05 DIAGNOSIS — F988 Other specified behavioral and emotional disorders with onset usually occurring in childhood and adolescence: Secondary | ICD-10-CM

## 2012-12-05 DIAGNOSIS — F331 Major depressive disorder, recurrent, moderate: Secondary | ICD-10-CM

## 2012-12-05 MED ORDER — QUETIAPINE FUMARATE 25 MG PO TABS: 25.0000 mg | ORAL_TABLET | Freq: Every day | ORAL | Status: DC

## 2012-12-05 MED ORDER — LISDEXAMFETAMINE DIMESYLATE 70 MG PO CAPS: 70.0000 mg | ORAL_CAPSULE | ORAL | Status: DC

## 2012-12-05 MED ORDER — SERTRALINE HCL 100 MG PO TABS: 200.0000 mg | ORAL_TABLET | Freq: Every day | ORAL | Status: DC

## 2012-12-05 MED ORDER — BUPROPION HCL ER (XL) 300 MG PO TB24: 300.0000 mg | ORAL_TABLET | Freq: Every day | ORAL | Status: DC

## 2012-12-05 NOTE — Progress Notes (Signed)
    [facility] Behavioral Health Follow-up Outpatient Visit  Alicia Aguilar August 02, 1946   Subjective: The patient is a 67 year old female who has not been followed by [facility] September of 2008. 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 At her last appointment, I did not make any changes. She presents today. She is still very low. She doesn't feel that she's doing well. She is crying easily. She's not sleeping well and using the Ativan to sleep. She feels that she needs a job, but has no motivation. Her elderly dog is dying. The patient reports she has no structure in no schedule. She is up all night. She is spending some time during the day in bed.  Filed Vitals:   12/05/12 1559  BP: 102/78    Mental Status Examination  Appearance: Neatly dressed Alert: Yes Attention: good  Cooperative: Yes Eye Contact: Good Speech: Normal rate rhythm and volume Psychomotor Activity: Normal Memory/Concentration: Intact Oriented: person, place, time/date and situation Mood: Euthymic Affect: Full Range Thought Processes and Associations: Logical Fund of Knowledge: Good Thought Content: No suicidal homicidal thoughts Insight: Fair Judgement: Fair  Diagnosis: Maj. depressive disorder recurrent moderate, ADHD inattentive type  Treatment Plan: I will start Seroquel 25 mg at bedtime to help with sleep as well as with low mood. I will continue the Zoloft to 200 mg daily, the Wellbutrin 300 mg daily, the Vyvanse at 70 mg daily, and Ativan as needed. I will see the patient back in one month. Patient may call with concerns. Jamse Mead, MD

## 2012-12-22 ENCOUNTER — Other Ambulatory Visit (HOSPITAL_COMMUNITY): Payer: Self-pay | Admitting: Psychiatry

## 2012-12-25 ENCOUNTER — Other Ambulatory Visit: Payer: Self-pay | Admitting: Family Medicine

## 2013-01-02 ENCOUNTER — Other Ambulatory Visit: Payer: Self-pay | Admitting: Family Medicine

## 2013-01-03 ENCOUNTER — Encounter (HOSPITAL_COMMUNITY): Payer: Self-pay | Admitting: Psychiatry

## 2013-01-03 ENCOUNTER — Ambulatory Visit (INDEPENDENT_AMBULATORY_CARE_PROVIDER_SITE_OTHER): Payer: Medicare Other | Admitting: Psychiatry

## 2013-01-03 VITALS — BP 100/68 | Ht 64.0 in | Wt 131.0 lb

## 2013-01-03 MED ORDER — SERTRALINE HCL 100 MG PO TABS: 200.0000 mg | ORAL_TABLET | Freq: Every day | ORAL | Status: DC

## 2013-01-03 MED ORDER — LAMOTRIGINE 25 MG PO TABS: ORAL_TABLET | ORAL | Status: DC

## 2013-01-03 NOTE — Progress Notes (Signed)
    [facility] Behavioral Health Follow-up Outpatient Visit  WINSTON SOBCZYK Aug 03, 1946   Subjective: The patient is a 67 year old female who has not been followed by [facility] September of 2008. 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 At her last appointment, I added Seroquel at 25 mg at bedtime to help with low mood and poor sleep. The patient presents today with her puppy Fredric Mare. She had to put her older dog down last weekend. She cried for 2 days. The patient has been out of her Zoloft for 2 days. She feels the Seroquel might of helped a little bit. She did wake up the night before last and that on plugged all the plugs in her bedroom. She still continues with low mood. She has tried to be in touch with her sister. Her sister is not return her phone calls. She looked as a tax value of the condo and is now $27,000 as opposed to $60,000 when they first inherited it. They're still belongings in it that she has not received. She is concerned about risks of CVA with Seroquel at her age. I do not want to go up on it.  Filed Vitals:   01/03/13 1450  BP: 100/68    Mental Status Examination  Appearance: Neatly dressed Alert: Yes Attention: good  Cooperative: Yes Eye Contact: Good Speech: Normal rate rhythm and volume Psychomotor Activity: Normal Memory/Concentration: Intact Oriented: person, place, time/date and situation Mood: Euthymic Affect: Full Range Thought Processes and Associations: Logical Fund of Knowledge: Good Thought Content: No suicidal homicidal thoughts Insight: Fair Judgement: Fair  Diagnosis: Maj. depressive disorder recurrent moderate, ADHD inattentive type  Treatment Plan: We'll discontinue the Seroquel. I'll start the patient on Lamictal 25 mg daily increasing to 50 mg daily. I will continue her Zoloft, Wellbutrin XL, Ativan and Vyvanse. I will see her back in one month. Patient may call with concerns. Jamse Mead, MD

## 2013-01-08 IMAGING — DX DG DXA BONE DENSITY STUDY HL7
3 series · 3 of 3 positions shown · non-contrast
Comparison: none

CLINICAL DATA: 66-year-old post menopausal Caucasian female,
history of hormone replacement therapy, vitamin D and calcium
supplementation.  Baseline examination.

[view not recorded (1 of 3)]
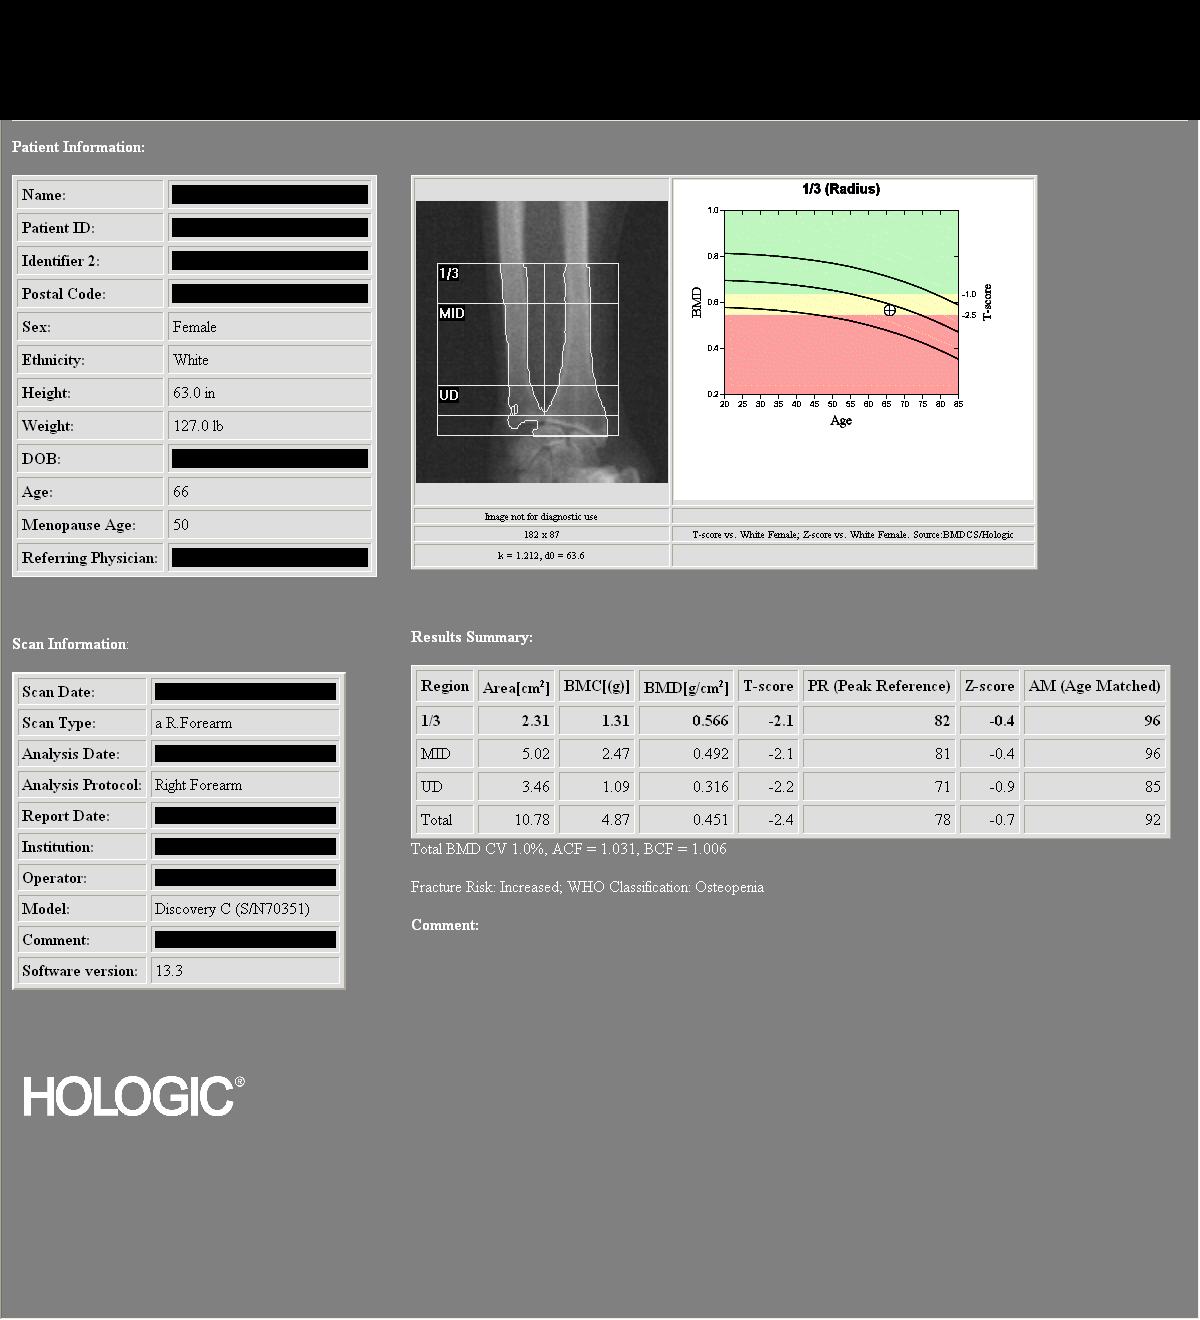

[view not recorded (2 of 3)]
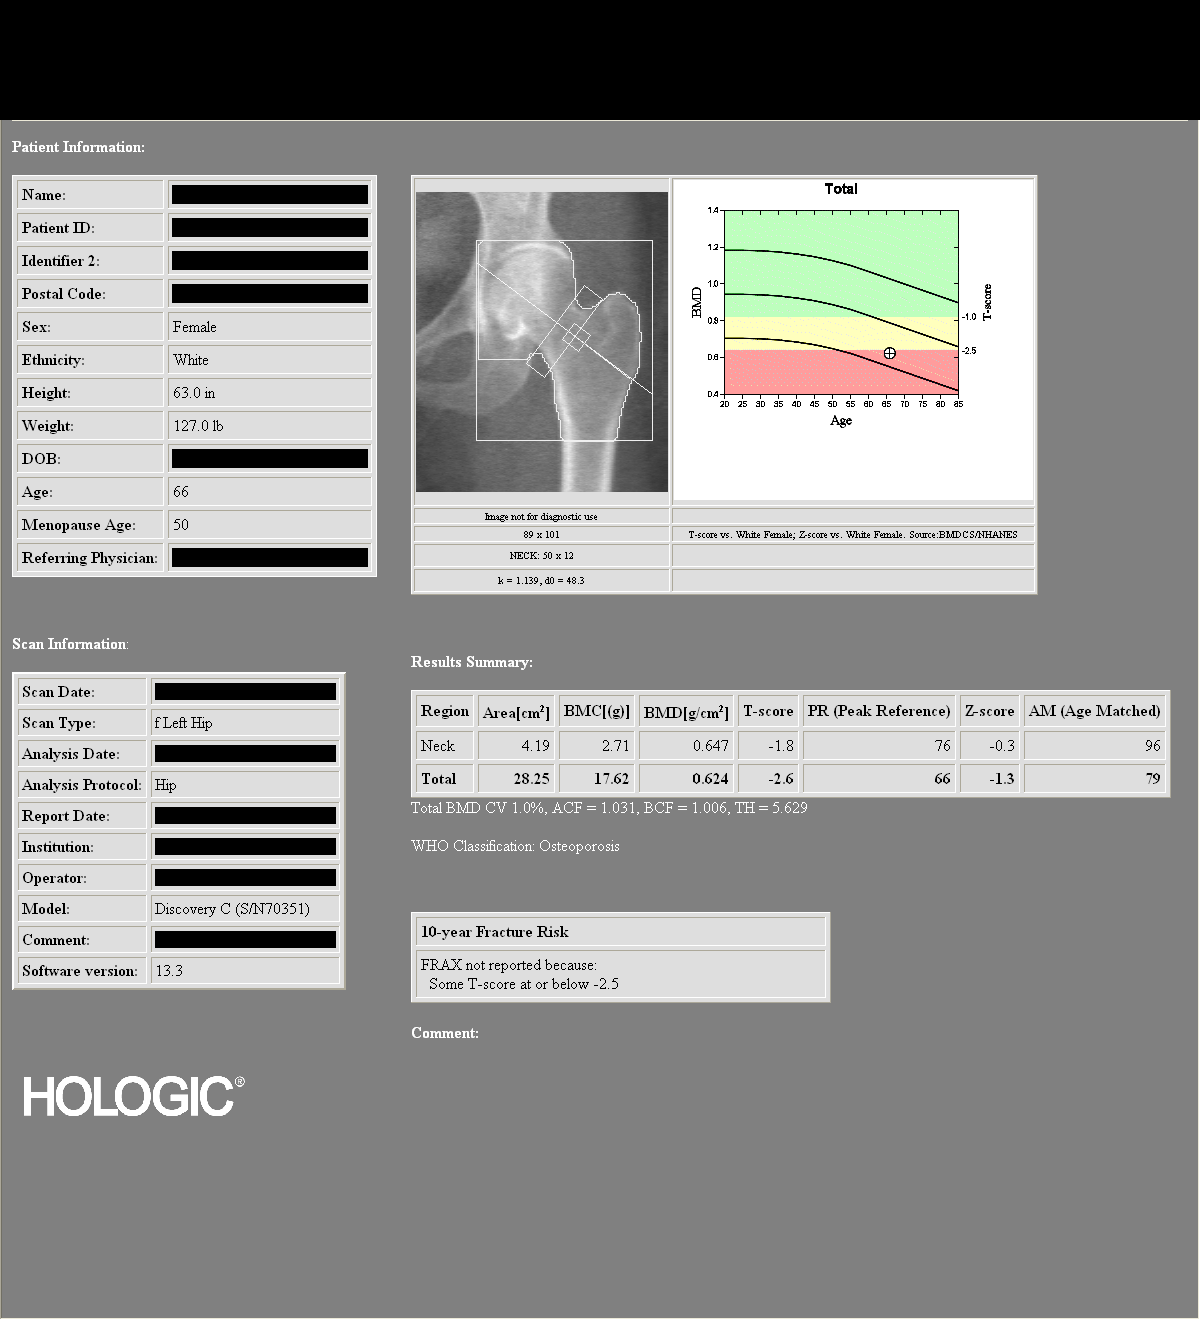

[view not recorded (3 of 3)]
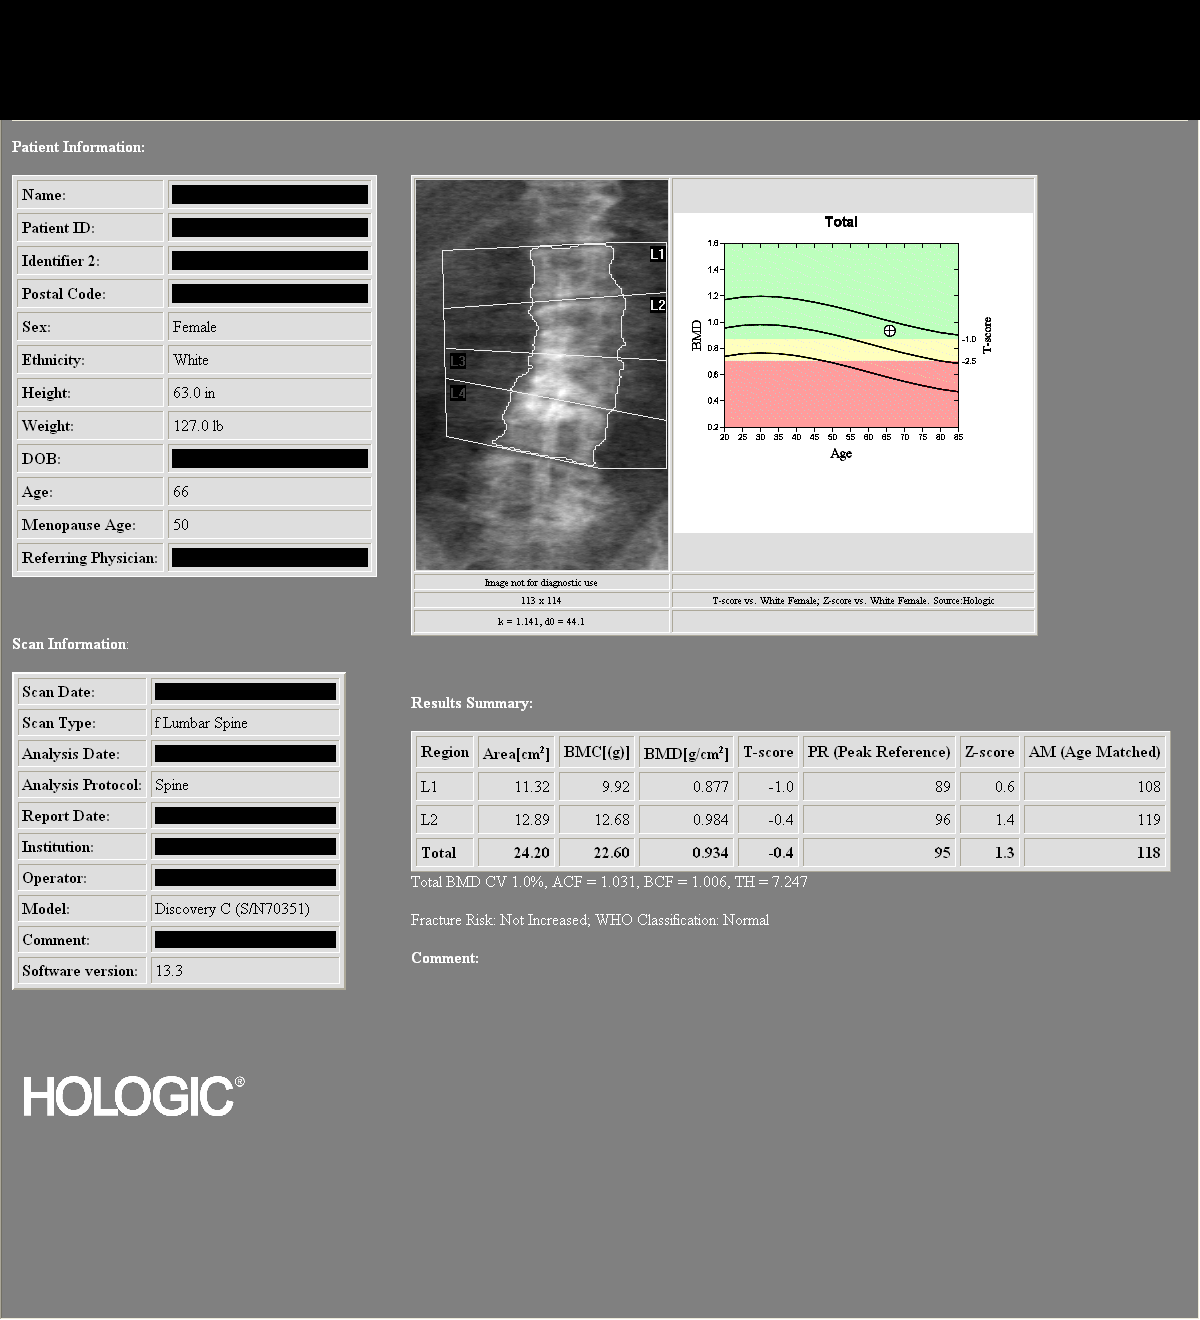

[3 of 3 positions shown; findings below may reference images not displayed]

DUAL X-RAY ABSORPTIOMETRY (DXA) FOR BONE MINERAL DENSITY

LEFT FEMUR TOTAL

Bone Mineral Density (BMD):                  0.624 g/cm2
Young Adult T Score:                                -2.6
Z-score:                                   -1.3

RIGHT FOREARM ([DATE] RADIUS)

Bone Mineral Density (BMD):                     0.566 g/cm2
Young Adult T Score:                                  -2.1
Z Score:                                      -0.4

ASSESSMENT:  Patient's diagnostic category is OSTEOPOROSIS by WHO
Criteria.

World Health Organization FRAX assessment of absolute fracture risk
is not calculated for this patient because the patient has
OSTEOPOROSIS.

The lumbar spine was excluded from the examination due to diffuse
degenerative changes.

RECOMMENDATIONS:

NOF guidelines recommend treatment for patients with a T-score of -
1.5 and below with clinical risk factors, or -2.0 and below without
clinical risk factors.  Effective therapies are available in the
form of bisphosphonates, selective estrogen receptor modulators,
biologic agents, and hormone replacement therapy (for women).  All
patients should ensure an adequate intake of dietary calcium (7988
mg daily) and vitamin D ([address] Orges) unless contraindicated.

FOLLOW-UP:

People with diagnosed cases of osteoporosis or at high risk for
fracture should have regular bone mineral density tests.  For
patients eligible for Medicare, routine testing is allowed once
every 2 years.  The testing frequency can be increased to one year
for patients who have rapidly progressing disease, those who are
receiving or discontinuing medical therapy to restore bone mass, or
have additional risk factors.

World Health Organization (WHO) Criteria:
Normal:  T scores from +1.0 to -1.0
Low Bone Mass (Osteopenia):  T scores between -1.0 and -2.5
Osteoporosis:  T scores -2.5 and below

Comparison to Reference Population:

T score is the key measure used in the diagnosis of osteoporosis
and relative risk determination for fracture.  It provides a value
for bone mass relative to the mean bone mass of a young adult
reference population expressed in terms of standard deviation (SD).

Z score is the age-matched score showing the patient's values
compared to a population matched for age, sex, and race.  This is
also expressed in terms of standard deviation.  The patient may
have values that compare favorably to the age-matched values and
still be at increased risk for fracture.

## 2013-01-26 ENCOUNTER — Encounter: Payer: Self-pay | Admitting: Family Medicine

## 2013-01-26 ENCOUNTER — Other Ambulatory Visit: Payer: Self-pay | Admitting: *Deleted

## 2013-01-26 ENCOUNTER — Ambulatory Visit (INDEPENDENT_AMBULATORY_CARE_PROVIDER_SITE_OTHER): Payer: Medicare Other | Admitting: Family Medicine

## 2013-01-26 VITALS — BP 152/88 | HR 86 | Wt 133.0 lb

## 2013-01-26 DIAGNOSIS — I10 Essential (primary) hypertension: Secondary | ICD-10-CM

## 2013-01-26 DIAGNOSIS — M79609 Pain in unspecified limb: Secondary | ICD-10-CM

## 2013-01-26 DIAGNOSIS — M79644 Pain in right finger(s): Secondary | ICD-10-CM

## 2013-01-26 MED ORDER — LOSARTAN POTASSIUM 25 MG PO TABS: ORAL_TABLET | ORAL | Status: DC

## 2013-01-26 NOTE — Progress Notes (Signed)
CC: Alicia Aguilar is a 67 y.o. female is here for Finger Injury   Subjective: HPI:  Patient complains of right index finger pain. This has been present for 1-1/2 weeks. This happened suddenly after she hit her finger with a hammer. She had immediate swelling. Pain was originally moderate in severity and localized to the PIP and radiated into the hand. Pain initially worse with flexion or extension. Pain has significantly improved, now nonexistent.   Initially had trouble with range of motion however now able to flex and extend without difficulty. She reports swelling has improved as well however is concerned about swelling around the PIP. No interventions as of yet. Not taking medications to manage discomfort. Denies weakness, motor sensory disturbances in the right hand. She wants to know if there were ways to to speed up recovery period.  Requesting refills on Cozaar. She's not sure whether or not she took it this morning but he is out of medication. Denies headaches, vision changes, motor sensory disturbances, chest pain, shortness of breath, cough    Review Of Systems Outlined In HPI  Past Medical History  Diagnosis Date  . 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  . Hyperlipidemia      Family History  Problem Relation Age of Onset  . Parkinsonism      family history  . Diabetes      family history  . Hypertension      family history  . Heart block      family history/cardiovascular disease  . Diabetes Mother   . Coronary artery disease Mother     CABG  . Dementia Father   . Coronary artery disease Sister      History  Substance Use Topics  . Smoking status: Current Every Day Smoker -- 1.00 packs/day for 20 years    Types: Cigarettes  . Smokeless tobacco: Not on file  . Alcohol Use: Yes     Comment: socially     Objective: Filed Vitals:   01/26/13 1338  BP: 152/88  Pulse: 86    General: Alert and Oriented, No Acute  Distress HEENT: Pupils equal, round, reactive to light. Conjunctivae clear. Moist mucous membranes Lungs: Clear to auscultation bilaterally, no wheezing/ronchi/rales.  Comfortable work of breathing. Good air movement. Cardiac: Regular rate and rhythm. Normal S1/S2.  No murmurs, rubs, nor gallops.   Extremities: No peripheral edema.  Strong peripheral pulses. Full-strength with index finger extension full-strength with index finger flexion. Full passive range of motion in the index finger, full active extension however flexion of the PIP limited to 45. No tenderness the PIP nor DIP joint nor MCP of the index finger. Mild ecchymosis of the PIP with swelling. Mental Status: No depression, anxiety, nor agitation. Skin: Warm and dry.  Assessment & Plan: Joe was seen today for finger injury.  Diagnoses and associated orders for this visit:  Essential hypertension, benign - losartan (COZAAR) 25 MG tablet; TAKE 1 TABLET (25 MG TOTAL) BY MOUTH DAILY.  Finger pain, right    Essential hypertension: Uncontrolled, restart daily losartan return to convenience her blood pressure check Right finger pain: Low suspicion of fracture at this point as I expect pain would not have improved so rapidly. I've instructed her on how to buddy tape the index finger to the middle finger and asked her to wear this for the next one to 2 weeks 24 hours a day. She declines pain medication recommendations.Signs  and symptoms requring emergent/urgent reevaluation were discussed with the patient.  Return in about 3 weeks (around 02/16/2013), or if symptoms worsen or fail to improve.

## 2013-02-06 ENCOUNTER — Other Ambulatory Visit (HOSPITAL_COMMUNITY): Payer: Self-pay | Admitting: Psychiatry

## 2013-02-08 ENCOUNTER — Encounter (HOSPITAL_COMMUNITY): Payer: Self-pay | Admitting: Psychiatry

## 2013-02-08 ENCOUNTER — Ambulatory Visit (INDEPENDENT_AMBULATORY_CARE_PROVIDER_SITE_OTHER): Payer: Medicare Other | Admitting: Psychiatry

## 2013-02-08 VITALS — BP 142/85 | Ht 64.0 in | Wt 131.0 lb

## 2013-02-08 DIAGNOSIS — F900 Attention-deficit hyperactivity disorder, predominantly inattentive type: Secondary | ICD-10-CM

## 2013-02-08 DIAGNOSIS — F988 Other specified behavioral and emotional disorders with onset usually occurring in childhood and adolescence: Secondary | ICD-10-CM

## 2013-02-08 DIAGNOSIS — F331 Major depressive disorder, recurrent, moderate: Secondary | ICD-10-CM

## 2013-02-08 MED ORDER — LAMOTRIGINE 100 MG PO TABS: 100.0000 mg | ORAL_TABLET | Freq: Every day | ORAL | Status: DC

## 2013-02-08 MED ORDER — LISDEXAMFETAMINE DIMESYLATE 70 MG PO CAPS: 70.0000 mg | ORAL_CAPSULE | ORAL | Status: DC

## 2013-02-08 NOTE — Progress Notes (Signed)
[facility] Behavioral Health Follow-up Outpatient Visit  Alicia Aguilar [birth year]   Subjective: The patient is a 67 year old female who has not been followed by [facility] September of 2008. 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 At her last appointment, I added a medical. Patient was concerned about taking Seroquel with the cerebrovascular risks. I continued her Zoloft, Wellbutrin XL, Ativan, and Vyvanse. She presents today. There has been no contact with her sister. Her ex-husband actually notes a Clinical research associate in [place]. The patient plans on having him discuss the case with a lawyer and have a lawyer speak to the sister. The patient saw Dr. Dorothe Pea upstairs. She had hit her index finger with him her. It was not broken, but she is strained ligaments. Her entire hand turn purple. The patient reports that during the visit she had elevated blood pressure. Her mom had a quadruple bypass, and patient is very concerned about cardiac issues. The patient went home and had a panic attack. She called her ex-husband to come get her to take her to be seen and evaluated. She was waiting outside he tried to call. There was no answer. He had EMS. The patient EMS workup was negative. The patient still complains of low mood. She's sleeping too much. She's not motivated. She is trying to find a job, but cannot. She finds this extremely frustrating. She is crying less with the Lamictal.  Filed Vitals:   02/08/13 1427  BP: 142/85   Active Ambulatory Problems    Diagnosis Date Noted  . TOBACCO ABUSE 01/19/2010  . MDD (major depressive disorder) 06/13/2007  . GERD 06/13/2007  . DISC DISEASE, LUMBAR 01/19/2010  . HYPERLIPIDEMIA 10/14/2010  . SHOULDER PAIN, RIGHT 10/14/2010  . Hypertension 03/23/2011  . Spinal stenosis 03/31/2012  . Heart murmur 03/31/2012  . Osteoporosis 04/18/2012  . COPD (chronic obstructive pulmonary disease) 05/01/2012  . Lumbar  radiculopathy, acute 05/22/2012  . Tachycardia 07/10/2012   Resolved Ambulatory Problems    Diagnosis Date Noted  . LIMB PAIN 02/26/2010  . Acute sinusitis, unspecified 05/28/2011   Past Medical History  Diagnosis Date  . Leg pain   . Hyperlipidemia    Current Outpatient Prescriptions on File Prior to Visit  Medication Sig Dispense Refill  . albuterol (PROVENTIL HFA;VENTOLIN HFA) 108 (90 BASE) MCG/ACT inhaler Inhale 2 puffs into the lungs every 6 (six) hours as needed for wheezing or shortness of breath.  1 Inhaler  2  . aspirin 81 MG tablet Take 81 mg by mouth daily.        Marland Kitchen buPROPion (WELLBUTRIN XL) 300 MG 24 hr tablet Take 1 tablet (300 mg total) by mouth daily.  90 tablet  3  . buPROPion (WELLBUTRIN XL) 300 MG 24 hr tablet TAKE 1 TABLET DAILY  90 tablet  3  . ibuprofen (ADVIL,MOTRIN) 200 MG tablet Take 400 mg by mouth every 8 (eight) hours as needed.       Marland Kitchen lisdexamfetamine (VYVANSE) 70 MG capsule Take 1 capsule (70 mg total) by mouth every morning.  90 capsule  0  . LORazepam (ATIVAN) 0.5 MG tablet Take 1 tablet (0.5 mg total) by mouth daily.  90 tablet  1  . losartan (COZAAR) 25 MG tablet TAKE 1 TABLET (25 MG TOTAL) BY MOUTH DAILY.  30 tablet  0  . Multiple Vitamin (MULTIVITAMIN) tablet Take 1 tablet by mouth daily.        Marland Kitchen omeprazole (PRILOSEC) 20 MG capsule Take  20 mg by mouth daily.        . QUEtiapine (SEROQUEL) 25 MG tablet Take 1 tablet (25 mg total) by mouth at bedtime.  30 tablet  2  . ranitidine (ZANTAC) 150 MG tablet Take 150 mg by mouth at bedtime.        . sertraline (ZOLOFT) 100 MG tablet Take 2 tablets (200 mg total) by mouth daily. Take 2 tabs by mouth once daily.  60 tablet  1  . simvastatin (ZOCOR) 80 MG tablet TAKE 1 TABLET AT BEDTIME  90 tablet  1   Current Facility-Administered Medications on File Prior to Visit  Medication Dose Route Frequency Provider Last Rate Last Dose  . albuterol (PROVENTIL) (5 MG/ML) 0.5% nebulizer solution 2.5 mg  2.5 mg  Nebulization Once Agapito Games, MD       Review of Systems - General ROS: positive for  - sleep disturbance Psychological ROS: positive for - anxiety and depression Cardiovascular ROS: no chest pain or dyspnea on exertion Musculoskeletal ROS: negative for - gait disturbance or muscular weakness Neurological ROS: negative for - headaches or seizures  Mental Status Examination  Appearance: Neatly dressed Alert: Yes Attention: good  Cooperative: Yes Eye Contact: Good Speech: Normal rate rhythm and volume Psychomotor Activity: Normal Memory/Concentration: Intact Oriented: person, place, time/date and situation Mood: Euthymic Affect: Full Range Thought Processes and Associations: Logical Fund of Knowledge: Good Thought Content: No suicidal homicidal thoughts Insight: Fair Judgement: Fair  Diagnosis: Maj. depressive disorder recurrent moderate, ADHD inattentive type  Treatment Plan: I will increase the Lamictal to 100 mg daily. I will continue her Zoloft, Wellbutrin XL, Ativan and Vyvanse. I will see her back in one month. Patient may call with concerns. Jamse Mead, MD

## 2013-03-15 ENCOUNTER — Encounter (HOSPITAL_COMMUNITY): Payer: Self-pay | Admitting: Psychiatry

## 2013-03-15 ENCOUNTER — Ambulatory Visit (INDEPENDENT_AMBULATORY_CARE_PROVIDER_SITE_OTHER): Payer: Medicare Other | Admitting: Psychiatry

## 2013-03-15 VITALS — BP 120/80 | Ht 64.0 in | Wt 129.0 lb

## 2013-03-15 DIAGNOSIS — F900 Attention-deficit hyperactivity disorder, predominantly inattentive type: Secondary | ICD-10-CM

## 2013-03-15 DIAGNOSIS — F988 Other specified behavioral and emotional disorders with onset usually occurring in childhood and adolescence: Secondary | ICD-10-CM

## 2013-03-15 DIAGNOSIS — F331 Major depressive disorder, recurrent, moderate: Secondary | ICD-10-CM

## 2013-03-15 MED ORDER — LISDEXAMFETAMINE DIMESYLATE 70 MG PO CAPS: 70.0000 mg | ORAL_CAPSULE | ORAL | Status: DC

## 2013-03-15 NOTE — Progress Notes (Signed)
[facility] Behavioral Health Follow-up Outpatient Visit  Alicia Aguilar 1946/06/24   Subjective: The patient is a 67 year old female who has not been followed by [facility] September of 2008. 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 At her last appointment, I increased Lamictal, and continued Zoloft, Wellbutrin, Vyvanse, and Ativan. She presents today. She continues to job hunt. She spent the majority of time on her computer looking for jobs. She applied to [employer]. They were not hiring at the time. Her granddaughter, who works at one in [facility], is supplying Education administrator. 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 There is still a creature in the house. She change bedrooms, but her dog is still bothered. The patient reports she has not had any more panic attacks. She is not crying as much. She is down 2 pounds today. She feels like her depression and anxiety are a little bit better. She knows that she will not be in her right state of mind until she gets a job.  Filed Vitals:   03/15/13 1452  BP: 120/80   Active Ambulatory Problems    Diagnosis Date Noted  . TOBACCO ABUSE 01/19/2010  . MDD (major depressive disorder) 06/13/2007  . GERD 06/13/2007  . DISC DISEASE, LUMBAR 01/19/2010  . HYPERLIPIDEMIA 10/14/2010  . SHOULDER PAIN, RIGHT 10/14/2010  . Hypertension 03/23/2011  . Spinal stenosis 03/31/2012  . Heart murmur 03/31/2012  . Osteoporosis 04/18/2012  . COPD (chronic obstructive pulmonary disease) 05/01/2012  . Lumbar radiculopathy, acute 05/22/2012  . Tachycardia 07/10/2012   Resolved Ambulatory Problems    Diagnosis Date Noted  . LIMB PAIN 02/26/2010  . Acute sinusitis, unspecified 05/28/2011   Past Medical History  Diagnosis Date  . Leg pain   . Hyperlipidemia    Current Outpatient Prescriptions on File Prior to Visit  Medication Sig Dispense Refill  .  albuterol (PROVENTIL HFA;VENTOLIN HFA) 108 (90 BASE) MCG/ACT inhaler Inhale 2 puffs into the lungs every 6 (six) hours as needed for wheezing or shortness of breath.  1 Inhaler  2  . aspirin 81 MG tablet Take 81 mg by mouth daily.        Marland Kitchen buPROPion (WELLBUTRIN XL) 300 MG 24 hr tablet Take 1 tablet (300 mg total) by mouth daily.  90 tablet  3  . buPROPion (WELLBUTRIN XL) 300 MG 24 hr tablet TAKE 1 TABLET DAILY  90 tablet  3  . ibuprofen (ADVIL,MOTRIN) 200 MG tablet Take 400 mg by mouth every 8 (eight) hours as needed.       . lamoTRIgine (LAMICTAL) 100 MG tablet Take 1 tablet (100 mg total) by mouth daily.  60 tablet  2  . lisdexamfetamine (VYVANSE) 70 MG capsule Take 1 capsule (70 mg total) by mouth every morning.  90 capsule  0  . LORazepam (ATIVAN) 0.5 MG tablet Take 1 tablet (0.5 mg total) by mouth daily.  90 tablet  1  . losartan (COZAAR) 25 MG tablet TAKE 1 TABLET (25 MG TOTAL) BY MOUTH DAILY.  30 tablet  0  . Multiple Vitamin (MULTIVITAMIN) tablet Take 1 tablet by mouth daily.        Marland Kitchen omeprazole (PRILOSEC) 20 MG capsule Take 20 mg by mouth daily.        . QUEtiapine (SEROQUEL) 25 MG tablet Take 1 tablet (25 mg total) by mouth at bedtime.  30 tablet  2  . ranitidine (ZANTAC) 150  MG tablet Take 150 mg by mouth at bedtime.        . sertraline (ZOLOFT) 100 MG tablet Take 2 tablets (200 mg total) by mouth daily. Take 2 tabs by mouth once daily.  60 tablet  1  . simvastatin (ZOCOR) 80 MG tablet TAKE 1 TABLET AT BEDTIME  90 tablet  1   Current Facility-Administered Medications on File Prior to Visit  Medication Dose Route Frequency Provider Last Rate Last Dose  . albuterol (PROVENTIL) (5 MG/ML) 0.5% nebulizer solution 2.5 mg  2.5 mg Nebulization Once Agapito Games, MD       Review of Systems - General ROS: positive for  - sleep disturbance Psychological ROS: positive for - anxiety and depression Cardiovascular ROS: no chest pain or dyspnea on exertion Musculoskeletal ROS: negative for  - gait disturbance or muscular weakness Neurological ROS: negative for - headaches or seizures  Mental Status Examination  Appearance: Neatly dressed Alert: Yes Attention: good  Cooperative: Yes Eye Contact: Good Speech: Normal rate rhythm and volume Psychomotor Activity: Normal Memory/Concentration: Intact Oriented: person, place, time/date and situation Mood: Euthymic Affect: Full Range Thought Processes and Associations: Logical Fund of Knowledge: Good Thought Content: No suicidal homicidal thoughts Insight: Fair Judgement: Fair  Diagnosis: Maj. depressive disorder recurrent moderate, ADHD inattentive type  Treatment Plan: I'll not make any changes today. I will continue her a Lamictal, Zoloft, Wellbutrin XL, Ativan and Vyvanse. I will see her back in two months. Patient may call with concerns. Jamse Mead, MD

## 2013-04-06 ENCOUNTER — Ambulatory Visit (INDEPENDENT_AMBULATORY_CARE_PROVIDER_SITE_OTHER): Payer: Medicare Other | Admitting: Family Medicine

## 2013-04-06 ENCOUNTER — Telehealth: Payer: Self-pay | Admitting: *Deleted

## 2013-04-06 ENCOUNTER — Encounter: Payer: Self-pay | Admitting: Family Medicine

## 2013-04-06 VITALS — BP 153/87 | HR 106 | Wt 123.0 lb

## 2013-04-06 DIAGNOSIS — M7989 Other specified soft tissue disorders: Secondary | ICD-10-CM

## 2013-04-06 NOTE — Telephone Encounter (Signed)
Gunnar Fusi with [facility] de[facility] calls with STAT left leg venous doppler results-  Negative

## 2013-04-06 NOTE — Progress Notes (Signed)
CC: Alicia Aguilar is a 67 y.o. female is here for left foot swelling   Subjective: HPI:  Patient complains of left leg swelling has been present for 4 days onset was abrupt. Localized to the shin distally slightly improved with elevation and sleeping worse throughout the day. Symptoms fluctuate from mild to moderate relative to above. It is not associated with discomfort. She denies overlying skin changes at the site of swelling. She denies recent injury or history of swelling. 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 She denies change in salt intake. Denies fevers, chills, cough, shortness of breath, chest pain, nor new motor or sensory disturbances in the left lower extremity.   Review Of Systems Outlined In HPI  Past Medical History  Diagnosis Date  . 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  . Hyperlipidemia      Family History  Problem Relation Age of Onset  . Parkinsonism      family history  . Diabetes      family history  . Hypertension      family history  . Heart block      family history/cardiovascular disease  . Diabetes Mother   . Coronary artery disease Mother     CABG  . Dementia Father   . Coronary artery disease Sister      History  Substance Use Topics  . Smoking status: Current Every Day Smoker -- 1.00 packs/day for 20 years    Types: Cigarettes  . Smokeless tobacco: Not on file  . Alcohol Use: Yes     Comment: socially     Objective: Filed Vitals:   04/06/13 1401  BP: 153/87  Pulse: 106    General: Alert and Oriented, No Acute Distress HEENT: Pupils equal, round, reactive to light. Conjunctivae clear.  Moist mucous membranes Lungs: Clear to auscultation bilaterally, no wheezing/ronchi/rales.  Comfortable work of breathing. Good air movement. Cardiac: Regular rate and rhythm.  Extremities: The left forefoot has mild to moderate pitting edema proximally to the shin. Negative  Hoffman no palpable cord in the calf muscle.  Strong peripheral pulses.  Mental Status: No depression, anxiety, nor agitation. Skin: Warm and dry. No overlying skin changes in the left lower extremity.  Assessment & Plan: [name] was seen today for left foot swelling.  Diagnoses and associated orders for this visit:  Swelling of left lower extremity - Lower Extremity Venous Duplex Left; Future    Discussed with patient my concern for DVT versus innocent venous insufficiency. Given the time of day a stat d-dimer is unlikely to come back until late tonight, I would like her to have a venous ultrasound this was arranged to be done stat.  Venous Doppler was negative patient was instructed to keep extremity elevated, compression wraps were provided to be worn on an as-needed basis, discussed importance of minimizing salt intake, encouraged relative increase in activity to help with venous return  Return if symptoms worsen or fail to improve.

## 2013-04-11 ENCOUNTER — Telehealth (HOSPITAL_COMMUNITY): Payer: Self-pay

## 2013-04-11 MED ORDER — LAMOTRIGINE 100 MG PO TABS: 100.0000 mg | ORAL_TABLET | Freq: Every day | ORAL | Status: DC

## 2013-04-11 NOTE — Telephone Encounter (Signed)
[facility] states you have information?

## 2013-04-11 NOTE — Telephone Encounter (Signed)
Completed.

## 2013-05-02 ENCOUNTER — Other Ambulatory Visit: Payer: Self-pay

## 2013-05-15 ENCOUNTER — Ambulatory Visit (HOSPITAL_COMMUNITY): Payer: Self-pay | Admitting: Psychiatry

## 2013-06-12 ENCOUNTER — Ambulatory Visit (INDEPENDENT_AMBULATORY_CARE_PROVIDER_SITE_OTHER): Payer: Medicare Other | Admitting: Psychiatry

## 2013-06-12 ENCOUNTER — Encounter (HOSPITAL_COMMUNITY): Payer: Self-pay | Admitting: Psychiatry

## 2013-06-12 VITALS — BP 122/78 | Ht 64.0 in | Wt 113.0 lb

## 2013-06-12 DIAGNOSIS — F331 Major depressive disorder, recurrent, moderate: Secondary | ICD-10-CM

## 2013-06-12 DIAGNOSIS — F900 Attention-deficit hyperactivity disorder, predominantly inattentive type: Secondary | ICD-10-CM

## 2013-06-12 DIAGNOSIS — F988 Other specified behavioral and emotional disorders with onset usually occurring in childhood and adolescence: Secondary | ICD-10-CM

## 2013-06-12 MED ORDER — LISDEXAMFETAMINE DIMESYLATE 70 MG PO CAPS: 70.0000 mg | ORAL_CAPSULE | ORAL | Status: DC

## 2013-06-12 MED ORDER — BUPROPION HCL ER (XL) 300 MG PO TB24: 300.0000 mg | ORAL_TABLET | Freq: Every day | ORAL | Status: AC

## 2013-06-12 MED ORDER — LORAZEPAM 0.5 MG PO TABS: 0.5000 mg | ORAL_TABLET | Freq: Every day | ORAL | Status: DC

## 2013-06-12 MED ORDER — SERTRALINE HCL 100 MG PO TABS: 200.0000 mg | ORAL_TABLET | Freq: Every day | ORAL | Status: AC

## 2013-06-12 MED ORDER — LAMOTRIGINE 100 MG PO TABS: 100.0000 mg | ORAL_TABLET | Freq: Every day | ORAL | Status: AC

## 2013-06-12 NOTE — Progress Notes (Signed)
[facility] Behavioral Health Follow-up Outpatient Visit  Alicia Aguilar [birth year]   Subjective: The patient is a 67 year old female who has not been followed by [facility] September of 2008. 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 At her last appointment, I DID not make any changes. She presents today along with her dog Trixie. She continues to job hunt. She is frustrated. She is down 16 pounds today. She's financially strapped. She did change her health insurance and needs all her prescriptions changed to a different pharmacy. She has spoken to her cousin. Her husband is actually sending this month's mortgage payment because the patient cannot afford to pay it. She did approach the bank, and was turned down for an extension. The patient feels that if and when she gets a job, things will be better. She has a possibility of night shift at [employer]. She is waiting to hear. She endorses good sleep. Her appetite has been down. Anxiety and depression are situational. There is no suicidal thoughts.  Filed Vitals:   06/12/13 1514  BP: 122/78   Active Ambulatory Problems    Diagnosis Date Noted  . TOBACCO ABUSE 01/19/2010  . MDD (major depressive disorder) 06/13/2007  . GERD 06/13/2007  . DISC DISEASE, LUMBAR 01/19/2010  . HYPERLIPIDEMIA 10/14/2010  . SHOULDER PAIN, RIGHT 10/14/2010  . Hypertension 03/23/2011  . Spinal stenosis 03/31/2012  . Heart murmur 03/31/2012  . Osteoporosis 04/18/2012  . COPD (chronic obstructive pulmonary disease) 05/01/2012  . Lumbar radiculopathy, acute 05/22/2012  . Tachycardia 07/10/2012   Resolved Ambulatory Problems    Diagnosis Date Noted  . LIMB PAIN 02/26/2010  . Acute sinusitis, unspecified 05/28/2011   Past Medical History  Diagnosis Date  . Leg pain   . Hyperlipidemia    Current Outpatient Prescriptions on File Prior to Visit  Medication Sig Dispense Refill  . albuterol (PROVENTIL  HFA;VENTOLIN HFA) 108 (90 BASE) MCG/ACT inhaler Inhale 2 puffs into the lungs every 6 (six) hours as needed for wheezing or shortness of breath.  1 Inhaler  2  . aspirin 81 MG tablet Take 81 mg by mouth daily.        Marland Kitchen ibuprofen (ADVIL,MOTRIN) 200 MG tablet Take 400 mg by mouth every 8 (eight) hours as needed.       Marland Kitchen losartan (COZAAR) 25 MG tablet TAKE 1 TABLET (25 MG TOTAL) BY MOUTH DAILY.  30 tablet  0  . Multiple Vitamin (MULTIVITAMIN) tablet Take 1 tablet by mouth daily.        Marland Kitchen omeprazole (PRILOSEC) 20 MG capsule Take 20 mg by mouth daily.        . ranitidine (ZANTAC) 150 MG tablet Take 150 mg by mouth at bedtime.        . simvastatin (ZOCOR) 80 MG tablet TAKE 1 TABLET AT BEDTIME  90 tablet  1   Current Facility-Administered Medications on File Prior to Visit  Medication Dose Route Frequency Provider Last Rate Last Dose  . albuterol (PROVENTIL) (5 MG/ML) 0.5% nebulizer solution 2.5 mg  2.5 mg Nebulization Once Agapito Games, MD       Review of Systems - General ROS: positive for  - sleep disturbance Psychological ROS: positive for - anxiety and depression Cardiovascular ROS: no chest pain or dyspnea on exertion Musculoskeletal ROS: negative for - gait disturbance or muscular weakness Neurological ROS: negative for - headaches or seizures  Mental Status Examination  Appearance: Neatly dressed Alert: Yes Attention: good  Cooperative: Yes Eye Contact: Good Speech: Normal rate rhythm and volume Psychomotor Activity: Normal Memory/Concentration: Intact Oriented: person, place, time/date and situation Mood: Euthymic Affect: Full Range Thought Processes and Associations: Logical Fund of Knowledge: Good Thought Content: No suicidal homicidal thoughts Insight: Fair Judgement: Fair  Diagnosis: Maj. depressive disorder recurrent moderate, ADHD inattentive type  Treatment Plan: I'll not make any changes today. I will continue her a Lamictal, Zoloft, Wellbutrin XL, Ativan and  Vyvanse. I will see her back in two months. Patient may call with concerns. Jamse Mead, MD

## 2013-06-14 ENCOUNTER — Other Ambulatory Visit: Payer: Self-pay | Admitting: *Deleted

## 2013-06-14 DIAGNOSIS — I10 Essential (primary) hypertension: Secondary | ICD-10-CM

## 2013-06-14 MED ORDER — LOSARTAN POTASSIUM 25 MG PO TABS: ORAL_TABLET | ORAL | Status: DC

## 2013-06-14 NOTE — Telephone Encounter (Signed)
Losartan sent to [facility] per pt request.

## 2013-06-22 ENCOUNTER — Telehealth (HOSPITAL_COMMUNITY): Payer: Self-pay

## 2013-06-22 NOTE — Telephone Encounter (Signed)
The patient reports she ran out of Vyvanse a couple weeks ago, and her insurance doesn't cover Vyvanse, she wanted another option other than than this as it $180.  She said she could wait until 06/25/2103 to discuss this with Dr. Christell Constant.

## 2013-06-25 MED ORDER — AMPHETAMINE-DEXTROAMPHET ER 20 MG PO CP24: 20.0000 mg | ORAL_CAPSULE | ORAL | Status: DC

## 2013-06-25 NOTE — Addendum Note (Signed)
Addended by: Elaina Pattee on: 06/25/2013 09:27 AM   Modules accepted: Orders

## 2013-06-25 NOTE — Telephone Encounter (Signed)
Will start Adderall XR 20 mg daily-

## 2013-08-13 ENCOUNTER — Ambulatory Visit (HOSPITAL_COMMUNITY): Payer: Self-pay | Admitting: Psychiatry

## 2013-08-27 ENCOUNTER — Ambulatory Visit (INDEPENDENT_AMBULATORY_CARE_PROVIDER_SITE_OTHER): Payer: Medicare Other | Admitting: Family Medicine

## 2013-08-27 ENCOUNTER — Encounter: Payer: Self-pay | Admitting: Family Medicine

## 2013-08-27 VITALS — BP 145/83 | HR 78 | Wt 125.0 lb

## 2013-08-27 DIAGNOSIS — R209 Unspecified disturbances of skin sensation: Secondary | ICD-10-CM

## 2013-08-27 DIAGNOSIS — J441 Chronic obstructive pulmonary disease with (acute) exacerbation: Secondary | ICD-10-CM

## 2013-08-27 DIAGNOSIS — J209 Acute bronchitis, unspecified: Secondary | ICD-10-CM

## 2013-08-27 DIAGNOSIS — R200 Anesthesia of skin: Secondary | ICD-10-CM

## 2013-08-27 DIAGNOSIS — Z23 Encounter for immunization: Secondary | ICD-10-CM

## 2013-08-27 MED ORDER — DOXYCYCLINE HYCLATE 100 MG PO TABS: 100.0000 mg | ORAL_TABLET | Freq: Two times a day (BID) | ORAL | Status: DC

## 2013-08-27 MED ORDER — ALBUTEROL SULFATE HFA 108 (90 BASE) MCG/ACT IN AERS: 2.0000 | INHALATION_SPRAY | Freq: Four times a day (QID) | RESPIRATORY_TRACT | Status: AC | PRN

## 2013-08-27 MED ORDER — PREDNISONE 20 MG PO TABS: ORAL_TABLET | ORAL | Status: DC

## 2013-08-27 NOTE — Progress Notes (Signed)
  Subjective:    Patient ID: Alicia Aguilar, female    DOB: [birth year], 67 y.o.   MRN: [id number]  HPI Thought had a URI last week with nasal congestion, sneezing and watery eyes. Now has moved into her chest and says has a dry cough with tightness in her chest. Hard time coughing up plegm. No fever, chills or sweats.  More SOB with activity. She did find her albuterol inhaler last night and did use it last night. She has not used it today. No GI symptoms. No worsening or alleviating factors.  She's also having numbness and tingling in her left hand. She's actually been pulling a lot of weeds with the hand. She did hurt her shoulder when a pulling weeds and wonders if it's coming from that. She's never really had problems with carpal tunnel syndrome. She has been taking Aleve a couple of times just to see if it would help. She thinks it may have 100% sure.   Review of Systems     Objective:   Physical Exam  Constitutional: She is oriented to person, place, and time. She appears well-developed and well-nourished.  HENT:  Head: Normocephalic and atraumatic.  Right Ear: External ear normal.  Left Ear: External ear normal.  Nose: Nose normal.  Mouth/Throat: Oropharynx is clear and moist.  TMs and canals are clear.   Eyes: Conjunctivae and EOM are normal. Pupils are equal, round, and reactive to light.  Neck: Neck supple. No thyromegaly present.  Cardiovascular: Normal rate, regular rhythm and normal heart sounds.   Pulmonary/Chest: Effort normal and breath sounds normal. She has no wheezes.  Lymphadenopathy:    She has no cervical adenopathy.  Neurological: She is alert and oriented to person, place, and time.  Skin: Skin is warm and dry.  Psychiatric: She has a normal mood and affect.          Assessment & Plan:  Acute bronchits/COPD exacerbation. She with doxycycline and a course of prednisone. Call if not significantly better in one week. Call if she feels worse or she starts  spiking temperature. Reminded her that she can use her albuterol every 4-6 hours as needed. 2-4 puffs if needed. She wasn't sure how many times a day it was okay to use it.  Left hand numbness and tingling-suspect overuse injury to the left wrist and possibly even carpal tunnel syndrome. Says she's going to be on a course of prednisone for her COPD exacerbation we'll see if this helps. Also avoid excessive overuse. The she says she still has a lot of weeds supple. It is still continues to bother her consider giving her a splint to wear at nighttime. When she completes the prednisone she can switch back to Aleve or Advil.

## 2013-08-30 ENCOUNTER — Telehealth: Payer: Self-pay | Admitting: *Deleted

## 2013-08-30 MED ORDER — AMBULATORY NON FORMULARY MEDICATION: Status: DC

## 2013-08-30 NOTE — Telephone Encounter (Signed)
Pt left message stating that even after all the meds you gave her on Mon, she still has a hard time getting a good breath in the morning.  She wants to know if a nebulizer would help? Please advise

## 2013-08-30 NOTE — Telephone Encounter (Signed)
If she's having a hard time getting deep breaths and to use the albuterol then we can order a nebulizer machine. We will have to order this for her home health company and have it delivered to her home. We can also write a prescription for them to deliver the medications for the nebulizer as well. Prescription presented for new machine and albuterol. Will need to be faxed to [facility]

## 2013-08-31 NOTE — Telephone Encounter (Signed)
LMOM notifying pt. 

## 2013-09-15 ENCOUNTER — Encounter: Payer: Self-pay | Admitting: Emergency Medicine

## 2013-09-15 ENCOUNTER — Inpatient Hospital Stay (HOSPITAL_COMMUNITY)
Admission: EM | Admit: 2013-09-15 | Discharge: 2013-09-21 | DRG: 287 | Disposition: A | Discharge status: Skilled Nursing Facility | Payer: Medicare Other | Attending: Internal Medicine | Admitting: Internal Medicine

## 2013-09-15 ENCOUNTER — Emergency Department (HOSPITAL_COMMUNITY): Payer: Medicare Other

## 2013-09-15 ENCOUNTER — Encounter (HOSPITAL_COMMUNITY): Payer: Self-pay | Admitting: Emergency Medicine

## 2013-09-15 ENCOUNTER — Emergency Department (INDEPENDENT_AMBULATORY_CARE_PROVIDER_SITE_OTHER)
Admission: EM | Admit: 2013-09-15 | Discharge: 2013-09-15 | Disposition: A | Discharge status: Other Acute Inpatient Hospital | Payer: Medicare Other | Source: Home / Self Care | Attending: Family Medicine | Admitting: Family Medicine

## 2013-09-15 ENCOUNTER — Emergency Department (INDEPENDENT_AMBULATORY_CARE_PROVIDER_SITE_OTHER): Payer: Medicare Other

## 2013-09-15 DIAGNOSIS — I517 Cardiomegaly: Secondary | ICD-10-CM

## 2013-09-15 DIAGNOSIS — R5381 Other malaise: Secondary | ICD-10-CM | POA: Diagnosis present

## 2013-09-15 DIAGNOSIS — R0989 Other specified symptoms and signs involving the circulatory and respiratory systems: Secondary | ICD-10-CM

## 2013-09-15 DIAGNOSIS — F17200 Nicotine dependence, unspecified, uncomplicated: Secondary | ICD-10-CM | POA: Diagnosis present

## 2013-09-15 DIAGNOSIS — E785 Hyperlipidemia, unspecified: Secondary | ICD-10-CM | POA: Diagnosis present

## 2013-09-15 DIAGNOSIS — R0902 Hypoxemia: Secondary | ICD-10-CM | POA: Diagnosis present

## 2013-09-15 DIAGNOSIS — I10 Essential (primary) hypertension: Secondary | ICD-10-CM

## 2013-09-15 DIAGNOSIS — R011 Cardiac murmur, unspecified: Secondary | ICD-10-CM

## 2013-09-15 DIAGNOSIS — I340 Nonrheumatic mitral (valve) insufficiency: Secondary | ICD-10-CM

## 2013-09-15 DIAGNOSIS — J90 Pleural effusion, not elsewhere classified: Secondary | ICD-10-CM

## 2013-09-15 DIAGNOSIS — E876 Hypokalemia: Secondary | ICD-10-CM

## 2013-09-15 DIAGNOSIS — J449 Chronic obstructive pulmonary disease, unspecified: Secondary | ICD-10-CM

## 2013-09-15 DIAGNOSIS — M5137 Other intervertebral disc degeneration, lumbosacral region: Secondary | ICD-10-CM

## 2013-09-15 DIAGNOSIS — F909 Attention-deficit hyperactivity disorder, unspecified type: Secondary | ICD-10-CM | POA: Diagnosis present

## 2013-09-15 DIAGNOSIS — I509 Heart failure, unspecified: Secondary | ICD-10-CM | POA: Diagnosis present

## 2013-09-15 DIAGNOSIS — I5041 Acute combined systolic (congestive) and diastolic (congestive) heart failure: Principal | ICD-10-CM | POA: Diagnosis present

## 2013-09-15 DIAGNOSIS — M4800 Spinal stenosis, site unspecified: Secondary | ICD-10-CM

## 2013-09-15 DIAGNOSIS — R000 Tachycardia, unspecified: Secondary | ICD-10-CM

## 2013-09-15 DIAGNOSIS — R0600 Dyspnea, unspecified: Secondary | ICD-10-CM

## 2013-09-15 DIAGNOSIS — F329 Major depressive disorder, single episode, unspecified: Secondary | ICD-10-CM | POA: Diagnosis present

## 2013-09-15 DIAGNOSIS — J069 Acute upper respiratory infection, unspecified: Secondary | ICD-10-CM

## 2013-09-15 DIAGNOSIS — I080 Rheumatic disorders of both mitral and aortic valves: Secondary | ICD-10-CM | POA: Diagnosis present

## 2013-09-15 DIAGNOSIS — M25519 Pain in unspecified shoulder: Secondary | ICD-10-CM

## 2013-09-15 DIAGNOSIS — K219 Gastro-esophageal reflux disease without esophagitis: Secondary | ICD-10-CM

## 2013-09-15 DIAGNOSIS — M5416 Radiculopathy, lumbar region: Secondary | ICD-10-CM

## 2013-09-15 DIAGNOSIS — Z7982 Long term (current) use of aspirin: Secondary | ICD-10-CM

## 2013-09-15 DIAGNOSIS — J4489 Other specified chronic obstructive pulmonary disease: Secondary | ICD-10-CM | POA: Diagnosis present

## 2013-09-15 DIAGNOSIS — J441 Chronic obstructive pulmonary disease with (acute) exacerbation: Secondary | ICD-10-CM

## 2013-09-15 DIAGNOSIS — I351 Nonrheumatic aortic (valve) insufficiency: Secondary | ICD-10-CM

## 2013-09-15 DIAGNOSIS — I428 Other cardiomyopathies: Secondary | ICD-10-CM | POA: Diagnosis present

## 2013-09-15 DIAGNOSIS — R0609 Other forms of dyspnea: Secondary | ICD-10-CM

## 2013-09-15 DIAGNOSIS — J811 Chronic pulmonary edema: Secondary | ICD-10-CM

## 2013-09-15 DIAGNOSIS — M810 Age-related osteoporosis without current pathological fracture: Secondary | ICD-10-CM

## 2013-09-15 DIAGNOSIS — I5021 Acute systolic (congestive) heart failure: Secondary | ICD-10-CM

## 2013-09-15 HISTORY — DX: Disorder of arteries and arterioles, unspecified: I77.9

## 2013-09-15 HISTORY — DX: Peripheral vascular disease, unspecified: I73.9

## 2013-09-15 HISTORY — DX: Chronic obstructive pulmonary disease, unspecified: J44.9

## 2013-09-15 HISTORY — DX: Nonrheumatic mitral (valve) insufficiency: I34.0

## 2013-09-15 HISTORY — DX: Essential (primary) hypertension: I10

## 2013-09-15 HISTORY — DX: Nonrheumatic aortic (valve) insufficiency: I35.1

## 2013-09-15 HISTORY — DX: Major depressive disorder, single episode, unspecified: F32.9

## 2013-09-15 HISTORY — DX: Attention-deficit hyperactivity disorder, unspecified type: F90.9

## 2013-09-15 LAB — URINALYSIS, ROUTINE W REFLEX MICROSCOPIC
Glucose, UA: NEGATIVE mg/dL
Ketones, ur: 15 mg/dL — AB
Leukocytes, UA: NEGATIVE
Nitrite: NEGATIVE
Protein, ur: NEGATIVE mg/dL
Urobilinogen, UA: 0.2 mg/dL (ref 0.0–1.0)

## 2013-09-15 LAB — CBC WITH DIFFERENTIAL/PLATELET
Basophils Absolute: 0 10*3/uL (ref 0.0–0.1)
Eosinophils Absolute: 0.1 10*3/uL (ref 0.0–0.7)
Eosinophils Relative: 1 % (ref 0–5)
HCT: 35.2 % — ABNORMAL LOW (ref 36.0–46.0)
Hemoglobin: 11.5 g/dL — ABNORMAL LOW (ref 12.0–15.0)
Lymphocytes Relative: 6 % — ABNORMAL LOW (ref 12–46)
MCH: 28.8 pg (ref 26.0–34.0)
MCHC: 32.7 g/dL (ref 30.0–36.0)
MCV: 88.2 fL (ref 78.0–100.0)
Monocytes Absolute: 0.2 10*3/uL (ref 0.1–1.0)
Monocytes Relative: 2 % — ABNORMAL LOW (ref 3–12)
RDW: 14.7 % (ref 11.5–15.5)
WBC: 9.5 10*3/uL (ref 4.0–10.5)

## 2013-09-15 LAB — COMPREHENSIVE METABOLIC PANEL
ALT: 12 U/L (ref 0–35)
AST: 20 U/L (ref 0–37)
CO2: 24 mEq/L (ref 19–32)
Calcium: 8.5 mg/dL (ref 8.4–10.5)
Creatinine, Ser: 0.81 mg/dL (ref 0.50–1.10)
GFR calc Af Amer: 85 mL/min — ABNORMAL LOW (ref >90)
GFR calc non Af Amer: 73 mL/min — ABNORMAL LOW (ref >90)
Potassium: 3.2 mEq/L — ABNORMAL LOW (ref 3.5–5.1)
Sodium: 142 mEq/L (ref 135–145)
Total Bilirubin: 0.4 mg/dL (ref 0.3–1.2)
Total Protein: 6.4 g/dL (ref 6.0–8.3)

## 2013-09-15 LAB — TROPONIN I: Troponin I: <0.3 ng/mL (ref <0.30)

## 2013-09-15 LAB — POCT I-STAT TROPONIN I: Troponin i, poc: 0.03 ng/mL (ref 0.00–0.08)

## 2013-09-15 MED ORDER — LAMOTRIGINE 100 MG PO TABS
100.0000 mg | ORAL_TABLET | Freq: Every day | ORAL | Status: DC
  Administered 2013-09-15 – 2013-09-21 (×7): 100 mg via ORAL
  Filled 2013-09-15 (×7): qty 1

## 2013-09-15 MED ORDER — ALBUTEROL SULFATE (5 MG/ML) 0.5% IN NEBU
2.5000 mg | INHALATION_SOLUTION | Freq: Four times a day (QID) | RESPIRATORY_TRACT | Status: DC
  Administered 2013-09-16 – 2013-09-17 (×6): 2.5 mg via RESPIRATORY_TRACT
  Filled 2013-09-15 (×6): qty 0.5

## 2013-09-15 MED ORDER — ONDANSETRON HCL 4 MG/2ML IJ SOLN
4.0000 mg | Freq: Four times a day (QID) | INTRAMUSCULAR | Status: DC | PRN
  Administered 2013-09-17: 4 mg via INTRAVENOUS
  Filled 2013-09-15: qty 2

## 2013-09-15 MED ORDER — ALBUTEROL SULFATE (5 MG/ML) 0.5% IN NEBU
2.5000 mg | INHALATION_SOLUTION | RESPIRATORY_TRACT | Status: DC
  Administered 2013-09-15: 20:00:00 2.5 mg via RESPIRATORY_TRACT
  Filled 2013-09-15: qty 0.5

## 2013-09-15 MED ORDER — SERTRALINE HCL 100 MG PO TABS
200.0000 mg | ORAL_TABLET | Freq: Every day | ORAL | Status: DC
  Administered 2013-09-15 – 2013-09-21 (×7): 200 mg via ORAL
  Filled 2013-09-15 (×7): qty 2

## 2013-09-15 MED ORDER — IPRATROPIUM-ALBUTEROL 0.5-2.5 (3) MG/3ML IN SOLN
3.0000 mL | Freq: Once | RESPIRATORY_TRACT | Status: AC
  Administered 2013-09-15: 3 mL via RESPIRATORY_TRACT

## 2013-09-15 MED ORDER — ALBUTEROL SULFATE (5 MG/ML) 0.5% IN NEBU: 2.5000 mg | INHALATION_SOLUTION | RESPIRATORY_TRACT | Status: DC | PRN

## 2013-09-15 MED ORDER — SODIUM CHLORIDE 0.9 % IV SOLN: 250.0000 mL | INTRAVENOUS | Status: DC | PRN

## 2013-09-15 MED ORDER — IBUPROFEN 600 MG PO TABS
600.0000 mg | ORAL_TABLET | Freq: Four times a day (QID) | ORAL | Status: DC | PRN
  Administered 2013-09-15: 600 mg via ORAL
  Filled 2013-09-15: qty 1

## 2013-09-15 MED ORDER — SODIUM CHLORIDE 0.9 % IJ SOLN
3.0000 mL | INTRAMUSCULAR | Status: DC | PRN
  Administered 2013-09-19: 13:00:00 3 mL via INTRAVENOUS

## 2013-09-15 MED ORDER — SODIUM CHLORIDE 0.9 % IJ SOLN
3.0000 mL | Freq: Two times a day (BID) | INTRAMUSCULAR | Status: DC
  Administered 2013-09-15 – 2013-09-21 (×10): 3 mL via INTRAVENOUS

## 2013-09-15 MED ORDER — IPRATROPIUM BROMIDE 0.02 % IN SOLN
0.5000 mg | Freq: Four times a day (QID) | RESPIRATORY_TRACT | Status: DC
  Administered 2013-09-16 – 2013-09-17 (×6): 0.5 mg via RESPIRATORY_TRACT
  Filled 2013-09-15 (×6): qty 2.5

## 2013-09-15 MED ORDER — IPRATROPIUM BROMIDE 0.02 % IN SOLN
0.5000 mg | RESPIRATORY_TRACT | Status: DC
  Administered 2013-09-15: 0.5 mg via RESPIRATORY_TRACT
  Filled 2013-09-15: qty 2.5

## 2013-09-15 MED ORDER — LORAZEPAM 0.5 MG PO TABS
0.5000 mg | ORAL_TABLET | Freq: Every day | ORAL | Status: DC | PRN
  Filled 2013-09-15: qty 1

## 2013-09-15 MED ORDER — BUPROPION HCL ER (XL) 300 MG PO TB24
300.0000 mg | ORAL_TABLET | Freq: Every day | ORAL | Status: DC
  Administered 2013-09-15 – 2013-09-21 (×7): 300 mg via ORAL
  Filled 2013-09-15 (×7): qty 1

## 2013-09-15 MED ORDER — ASPIRIN EC 81 MG PO TBEC
81.0000 mg | DELAYED_RELEASE_TABLET | Freq: Every day | ORAL | Status: DC
  Administered 2013-09-15 – 2013-09-21 (×6): 81 mg via ORAL
  Filled 2013-09-15 (×8): qty 1

## 2013-09-15 MED ORDER — ACETAMINOPHEN 325 MG PO TABS
650.0000 mg | ORAL_TABLET | ORAL | Status: DC | PRN
  Administered 2013-09-16 – 2013-09-21 (×4): 650 mg via ORAL
  Filled 2013-09-15 (×4): qty 2

## 2013-09-15 MED ORDER — ATORVASTATIN CALCIUM 40 MG PO TABS
40.0000 mg | ORAL_TABLET | Freq: Every day | ORAL | Status: DC
  Administered 2013-09-15 – 2013-09-20 (×6): 40 mg via ORAL
  Filled 2013-09-15 (×7): qty 1

## 2013-09-15 MED ORDER — FUROSEMIDE 10 MG/ML IJ SOLN
40.0000 mg | Freq: Two times a day (BID) | INTRAMUSCULAR | Status: DC
  Administered 2013-09-15 – 2013-09-17 (×4): 40 mg via INTRAVENOUS
  Filled 2013-09-15 (×5): qty 4

## 2013-09-15 MED ORDER — ENOXAPARIN SODIUM 40 MG/0.4ML ~~LOC~~ SOLN
40.0000 mg | SUBCUTANEOUS | Status: DC
  Administered 2013-09-15 – 2013-09-20 (×5): 40 mg via SUBCUTANEOUS
  Filled 2013-09-15 (×7): qty 0.4

## 2013-09-15 MED ORDER — LOSARTAN POTASSIUM 25 MG PO TABS
25.0000 mg | ORAL_TABLET | Freq: Every day | ORAL | Status: DC
  Administered 2013-09-15 – 2013-09-21 (×7): 25 mg via ORAL
  Filled 2013-09-15 (×7): qty 1

## 2013-09-15 MED ORDER — ALBUTEROL SULFATE (5 MG/ML) 0.5% IN NEBU
2.5000 mg | INHALATION_SOLUTION | Freq: Once | RESPIRATORY_TRACT | Status: AC
Start: 2013-09-15 — End: 2013-09-15
  Administered 2013-09-15: 2.5 mg via RESPIRATORY_TRACT
  Filled 2013-09-15: qty 0.5

## 2013-09-15 MED ORDER — METHYLPREDNISOLONE SODIUM SUCC 125 MG IJ SOLR
125.0000 mg | Freq: Once | INTRAMUSCULAR | Status: AC
  Administered 2013-09-15: 125 mg via INTRAMUSCULAR

## 2013-09-15 MED ORDER — POTASSIUM CHLORIDE CRYS ER 20 MEQ PO TBCR
40.0000 meq | EXTENDED_RELEASE_TABLET | ORAL | Status: AC
Start: 2013-09-15 — End: 2013-09-15
  Administered 2013-09-15 (×2): 40 meq via ORAL
  Filled 2013-09-15 (×2): qty 2

## 2013-09-15 MED ORDER — FAMOTIDINE 20 MG PO TABS
20.0000 mg | ORAL_TABLET | Freq: Every day | ORAL | Status: DC
  Administered 2013-09-15 – 2013-09-21 (×7): 20 mg via ORAL
  Filled 2013-09-15 (×7): qty 1

## 2013-09-15 MED ORDER — FUROSEMIDE 10 MG/ML IJ SOLN
40.0000 mg | Freq: Once | INTRAMUSCULAR | Status: AC
  Administered 2013-09-15: 40 mg via INTRAVENOUS
  Filled 2013-09-15: qty 4

## 2013-09-15 NOTE — ED Notes (Signed)
To room via EMS.  Onset 2-3 weeks dyspnea, wheezing, cough.  Pt was placed on prednisone and doxy.  Symptoms worsened over past 2-3 days with progressive shortness of breath.

## 2013-09-15 NOTE — ED Provider Notes (Signed)
CSN: [id number]     Arrival date & time 09/15/13  1138 History   First MD Initiated Contact with Patient 09/15/13 1140     Chief Complaint  Patient presents with  . Shortness of Breath   HPI  Patient presents with concern of dyspnea.  This episode began approximately 2 weeks ago.  Initially the patient had some improvement, though over the past days she has had increasing symptoms. Patient was previously on steroids, doxycycline. Today with increasing symptoms and she went to urgent care.  There she received a bolus of steroids, and inhaled albuterol.  She notes that this improved her condition substantially. However, per report the patient was hypoxic on arrival, had a new oxygen requirement even after treatment. The patient denies fevers, chills, nausea, vomiting, diarrhea, belly pain, chest pain aside from when she is coughing. She is a notable history of smoking, COPD.  She denies history of MI.  Past Medical History  Diagnosis Date  . 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  . Hyperlipidemia   . COPD (chronic obstructive pulmonary disease)   . Hypertension    Past Surgical History  Procedure Laterality Date  . Tubal ligation      bilateral  . Retinal tear repair cryotherapy    . Laminectomy  03-2010    T12 and L1   Family History  Problem Relation Age of Onset  . Parkinsonism      family history  . Diabetes      family history  . Hypertension      family history  . Heart block      family history/cardiovascular disease  . Diabetes Mother   . Coronary artery disease Mother     CABG  . Dementia Father   . Coronary artery disease Sister    History  Substance Use Topics  . Smoking status: Current Every Day Smoker -- 1.00 packs/day for 20 years    Types: Cigarettes  . Smokeless tobacco: Not on file  . Alcohol Use: Yes     Comment: socially   OB History   Grav Para Term Preterm Abortions TAB SAB Ect Mult Living                  Review of Systems  Constitutional:       Per HPI, otherwise negative  HENT:       Per HPI, otherwise negative  Respiratory:       Per HPI, otherwise negative  Cardiovascular:       Per HPI, otherwise negative  Gastrointestinal: Negative for vomiting.  Endocrine:       Negative aside from HPI  Genitourinary:       Neg aside from HPI   Musculoskeletal:       Per HPI, otherwise negative  Skin: Negative.   Neurological: Negative for syncope.    Allergies  Fosamax; Lisinopril; and Erythromycin  Home Medications   Current Outpatient Rx  Name  Route  Sig  Dispense  Refill  . albuterol (PROVENTIL HFA;VENTOLIN HFA) 108 (90 BASE) MCG/ACT inhaler   Inhalation   Inhale 2 puffs into the lungs every 6 (six) hours as needed for wheezing or shortness of breath.   1 Inhaler   2   . AMBULATORY NON FORMULARY MEDICATION      Medication Name: Nebulizer for COPD. Albuterol 2.5 mg vial every 4 hoursPRN for shortness of breath, wheezing. #100 vials. One refill.  1 vial   0   . amphetamine-dextroamphetamine (ADDERALL XR) 20 MG 24 hr capsule   Oral   Take 1 capsule (20 mg total) by mouth every morning.   30 capsule   0   . aspirin 81 MG tablet   Oral   Take 81 mg by mouth daily.           Marland Kitchen buPROPion (WELLBUTRIN XL) 300 MG 24 hr tablet   Oral   Take 1 tablet (300 mg total) by mouth daily.   30 tablet   2   . doxycycline (VIBRA-TABS) 100 MG tablet   Oral   Take 1 tablet (100 mg total) by mouth 2 (two) times daily.   20 tablet   0   . ibuprofen (ADVIL,MOTRIN) 200 MG tablet   Oral   Take 400 mg by mouth every 8 (eight) hours as needed.          . lamoTRIgine (LAMICTAL) 100 MG tablet   Oral   Take 1 tablet (100 mg total) by mouth daily.   30 tablet   2   . losartan (COZAAR) 25 MG tablet      TAKE 1 TABLET (25 MG TOTAL) BY MOUTH DAILY.   30 tablet   2   . Multiple Vitamin (MULTIVITAMIN) tablet   Oral   Take 1 tablet by mouth daily.           Marland Kitchen  omeprazole (PRILOSEC) 20 MG capsule   Oral   Take 20 mg by mouth daily.           . predniSONE (DELTASONE) 20 MG tablet      2 tabs poo QD x 5 days then one a day x 5 days.   15 tablet   0   . ranitidine (ZANTAC) 150 MG tablet   Oral   Take 150 mg by mouth at bedtime.           . sertraline (ZOLOFT) 100 MG tablet   Oral   Take 2 tablets (200 mg total) by mouth daily.   60 tablet   2   . simvastatin (ZOCOR) 80 MG tablet      TAKE 1 TABLET AT BEDTIME   90 tablet   1    SpO2 100% Physical Exam  Nursing note and vitals reviewed. Constitutional: She is oriented to person, place, and time. She appears well-developed and well-nourished. No distress.  HENT:  Head: Normocephalic and atraumatic.  Eyes: Conjunctivae and EOM are normal.  Cardiovascular: Normal rate and regular rhythm.   Pulmonary/Chest: Effort normal. No stridor. No respiratory distress. She has decreased breath sounds. She has no wheezes.  Abdominal: She exhibits no distension.  Musculoskeletal:  Trace of edema and left lower extremity, no other deformities, no complaints per  Neurological: She is alert and oriented to person, place, and time. No cranial nerve deficit.  Skin: Skin is warm and dry.  Psychiatric: She has a normal mood and affect.    ED Course  Procedures (including critical care time) Labs Review Labs Reviewed  CBC WITH DIFFERENTIAL  COMPREHENSIVE METABOLIC PANEL  PRO B NATRIURETIC PEPTIDE  URINALYSIS, ROUTINE W REFLEX MICROSCOPIC   Imaging Review Dg Chest 2 View  09/15/2013   CLINICAL DATA:  Shortness of Breath  EXAM: CHEST - 2 VIEW  COMPARISON:  02/10/2010  FINDINGS: Moderate cardiomegaly has developed. New central pulmonary vascular congestion. New perihilar and bibasilar interstitial edema or infiltrates. Small bilateral pleural effusions with adjacent atelectasis/ consolidation in  the lung bases, right greater than left. Tortuous thoracic aorta. Degenerative changes noted at the  thoracolumbar junction.  IMPRESSION: 1. Cardiomegaly, pulmonary vascular congestion ,bibasilar edema/ infiltrates, and small effusions suggesting CHF.   Electronically Signed   By: Oley Balm M.D.   On: 09/15/2013 10:35    EKG Interpretation    Date/Time:  Saturday September 15 2013 11:53:59 EST Ventricular Rate:  109 PR Interval:  167 QRS Duration: 100 QT Interval:  364 QTC Calculation: 490 R Axis:   32 Text Interpretation:  Sinus tachycardia Probable left atrial enlargement Borderline low voltage, extremity leads Left ventricular hypertrophy Borderline prolonged QT interval Baseline wander in lead(s) V3 Sinus tachycardia Artifact Left ventricular hypertrophy Abnormal ekg Confirmed by Gerhard Munch  MD ([id number]) on 09/15/2013 1:30:19 PM           Pulse oximetry 100% with nasal cannula abnormal Cardiac monitor has sinus rhythm, rate 90, unremarkable EKG has sinus rhythm, rate 109, tachycardia with significant artifacts, no overt ST changes.  Abnormal   Update: On repeat exam the patient remained tachycardic, tachypneic.  No new complaints.  She and a friend are aware of all results.  Chart reviewed and his last echocardiogram was approximately 18 months ago, with preserved ejection fraction. MDM  No diagnosis found. Patient presents with dyspnea.  She does have history of COPD, but no previously diagnosed heart failure.  This evaluation is most notable for elevated BNP.  Patient has edema in her left lower extremity, pulmonary congestion, started on Lasix, but required admission for further evaluation and management.     Gerhard Munch, MD 09/15/13 [phone]

## 2013-09-15 NOTE — H&P (Addendum)
[facility] History and Physical  ANTOINETTE BORGWARDT ZOX:[id number] DOB: July 13, 1946 DOA: 09/15/2013  Referring physician: er PCP: Nani Gasser, MD  Specialists:   Chief Complaint: SOB  HPI: Alicia Aguilar is a 67 y.o. female  Who comes in c/o SOB that began approximately 2 weeks ago. Patient was previously on steroids, doxycycline and Initially had some improvement, though over the past days she has had increasing symptoms.    Today with increasing symptoms and she went to urgent care. There she received a bolus of steroids, and inhaled albuterol. She notes that this improved her condition substantially.  However, per report the patient was hypoxic when ambulating and was requiring O2 even after treatment.  In the ER, patient was found to have edema in her left lower extremity, BNP elevated, pulmonary congestion on x ray and was started on Lasix She does not have a history of CHF- last echo was: 03/2012 and found: Left ventricle: The cavity size was normal. Wall thickness was increased in a pattern of mild LVH. Systolic functionwas normal. The estimated ejection fraction was in therange of 55% to 60%. Wall motion was normal; there were noregional wall motion abnormalities. Doppler parameters are consistent with abnormal left ventricular relaxation(grade 1 diastolic dysfunction). - Aortic valve: Moderate regurgitation. - Mitral valve: Mild regurgitation.    Review of Systems: all systems reviewed, negative unless stated above   Past Medical History  Diagnosis Date  . 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  . Hyperlipidemia   . COPD (chronic obstructive pulmonary disease)   . Hypertension    Past Surgical History  Procedure Laterality Date  . Tubal ligation      bilateral  . Retinal tear repair cryotherapy    . Laminectomy  03-2010    T12 and L1   Social History:  reports that she has been smoking Cigarettes.  She has a 20  pack-year smoking history. She does not have any smokeless tobacco history on file. She reports that she drinks alcohol. She reports that she does not use illicit drugs.   Allergies  Allergen Reactions  . Fosamax [Alendronate Sodium] Other (See Comments)    Acid reflux  . Lisinopril Cough  . Erythromycin Rash    Family History  Problem Relation Age of Onset  . Parkinsonism      family history  . Diabetes      family history  . Hypertension      family history  . Heart block      family history/cardiovascular disease  . Diabetes Mother   . Coronary artery disease Mother     CABG  . Dementia Father   . Coronary artery disease Sister     Prior to Admission medications   Medication Sig Start Date End Date Taking? Authorizing Provider  albuterol (PROVENTIL HFA;VENTOLIN HFA) 108 (90 BASE) MCG/ACT inhaler Inhale 2 puffs into the lungs every 6 (six) hours as needed for wheezing or shortness of breath. 08/27/13 08/27/14 Yes Agapito Games, MD  aspirin 81 MG tablet Take 81 mg by mouth daily.     Yes Historical Provider, MD  buPROPion (WELLBUTRIN XL) 300 MG 24 hr tablet Take 1 tablet (300 mg total) by mouth daily. 06/12/13  Yes Jamse Mead, MD  Glycerin-Polysorbate 80 (REFRESH DRY EYE THERAPY OP) Place 2 drops into both eyes daily as needed (dry eyes).   Yes Historical Provider, MD  ibuprofen (ADVIL,MOTRIN) 200 MG tablet Take  400 mg by mouth every 4 (four) hours as needed for moderate pain.    Yes Historical Provider, MD  lamoTRIgine (LAMICTAL) 100 MG tablet Take 1 tablet (100 mg total) by mouth daily. 06/12/13  Yes Jamse Mead, MD  LORazepam (ATIVAN) 0.5 MG tablet Take 0.5 mg by mouth daily as needed for anxiety.   Yes Historical Provider, MD  losartan (COZAAR) 25 MG tablet Take 25 mg by mouth daily.   Yes Historical Provider, MD  omeprazole (PRILOSEC) 20 MG capsule Take 20 mg by mouth daily.     Yes Historical Provider, MD  ranitidine (ZANTAC) 150 MG tablet Take 150  mg by mouth at bedtime.     Yes Historical Provider, MD  sertraline (ZOLOFT) 100 MG tablet Take 2 tablets (200 mg total) by mouth daily. 06/12/13  Yes Jamse Mead, MD  simvastatin (ZOCOR) 80 MG tablet Take 80 mg by mouth at bedtime.   Yes Historical Provider, MD  AMBULATORY NON FORMULARY MEDICATION Medication Name: Nebulizer for COPD. Albuterol 2.5 mg vial every 4 hoursPRN for shortness of breath, wheezing. #100 vials. One refill. 08/30/13   Agapito Games, MD  doxycycline (VIBRA-TABS) 100 MG tablet Take 1 tablet (100 mg total) by mouth 2 (two) times daily. 08/27/13   Agapito Games, MD   Physical Exam: Filed Vitals:   09/15/13 1415  BP: 157/91  Pulse: 108  Temp:   Resp: 17     General:  On 2L O2, pleasant/cooperative, NAD  Eyes: wnl  ENT: wnl  Neck: supple, no JVD  Cardiovascular: rrr, +murmur  Respiratory: crackles at bases  Abdomen: +BS, soft  Skin:  No rashes or lesions  Musculoskeletal: moves all 4 ext  Psychiatric: mood stable  Neurologic: no focal deficit  Labs on Admission:  Basic Metabolic Panel:  Recent Labs Lab 09/15/13 1210  NA 142  K 3.2*  CL 103  CO2 24  GLUCOSE 101*  BUN 17  CREATININE 0.81  CALCIUM 8.5   Liver Function Tests:  Recent Labs Lab 09/15/13 1210  AST 20  ALT 12  ALKPHOS 127*  BILITOT 0.4  PROT 6.4  ALBUMIN 3.1*   No results found for this basename: LIPASE, AMYLASE,  in the last 168 hours No results found for this basename: AMMONIA,  in the last 168 hours CBC:  Recent Labs Lab 09/15/13 1210  WBC 9.5  NEUTROABS 8.7*  HGB 11.5*  HCT 35.2*  MCV 88.2  PLT 227   Cardiac Enzymes: No results found for this basename: CKTOTAL, CKMB, CKMBINDEX, TROPONINI,  in the last 168 hours  BNP (last 3 results)  Recent Labs  09/15/13 1210  PROBNP 12210.0*   CBG: No results found for this basename: GLUCAP,  in the last 168 hours  Radiological Exams on Admission: Dg Chest 2 View  09/15/2013   CLINICAL  DATA:  Shortness of Breath  EXAM: CHEST - 2 VIEW  COMPARISON:  Earlier films of the same day  FINDINGS: Stable cardiomegaly. Pulmonary vascular congestion with some increase in mild interstitial edema since previous study, septal lines seen now peripherally in both lung bases. Suspect tiny pleural effusions as before, with adjacent atelectasis/ infiltrate in the lung bases, right greater than left. Spondylitic changes at thoracolumbar junction. . Tortuous thoracic aorta.  IMPRESSION: Stable cardiomegaly and effusions with slight worsening of bilateral interstitial edema   Electronically Signed   By: Oley Balm M.D.   On: 09/15/2013 13:56   Dg Chest 2 View  09/15/2013  CLINICAL DATA:  Shortness of Breath  EXAM: CHEST - 2 VIEW  COMPARISON:  02/10/2010  FINDINGS: Moderate cardiomegaly has developed. New central pulmonary vascular congestion. New perihilar and bibasilar interstitial edema or infiltrates. Small bilateral pleural effusions with adjacent atelectasis/ consolidation in the lung bases, right greater than left. Tortuous thoracic aorta. Degenerative changes noted at the thoracolumbar junction.  IMPRESSION: 1. Cardiomegaly, pulmonary vascular congestion ,bibasilar edema/ infiltrates, and small effusions suggesting CHF.   Electronically Signed   By: Oley Balm M.D.   On: 09/15/2013 10:35    EKG: Independently reviewed. Mild sinus tach  Assessment/Plan Active Problems:   TOBACCO ABUSE   MDD (major depressive disorder)   Hypertension   COPD (chronic obstructive pulmonary disease)   Dyspnea   Hypokalemia   1. SOB with prob new CHF- check echo, cycle CE, TSH, lasix IV, strict I/Os, daily weights- weight 10-15 above from 05/2013 2. MDD- continue home meds 3. HTN- coozar 4. Hypokalemia- replete 5. Tobacco abuse- encourage cessation 6. H/o COPD- not currently wheezing- will add nebs    Code Status: full Family Communication: patient and friend Disposition Plan: admit  Time  spent: 75 min  Benjamine Mola Praneel Haisley [facility] Pager [phone]  If 7PM-7AM, please contact night-coverage [email or web address] Password [facility] 09/15/2013, 3:04 PM

## 2013-09-15 NOTE — ED Provider Notes (Signed)
CSN: [id number]     Arrival date & time 09/15/13  4098 History   First MD Initiated Contact with Patient 09/15/13 [phone]     Chief Complaint  Patient presents with  . Nasal Congestion  . Shortness of Breath    HPI  URI Symptoms Onset: 3-4 weeks  Description: dyspnea, cough, increased sputum production, wheezing  Modifying factors:  1ppd smoker, COPD. Was seen at PCPs office 2-3 weeks ago with similar sxs. Placed on prednisone and doxy. Sxs seemed to worsen over past 2-3 days with progressive dyspnea.   Symptoms Nasal discharge: yes Fever: no Sore throat: no Cough: yes Wheezing: yes Ear pain: no GI symptoms: no Sick contacts: no  Red Flags  Stiff neck: no Dyspnea: yes Rash: no Swallowing difficulty: no  Sinusitis Risk Factors Headache/face pain: no Double sickening: no tooth pain: no  Allergy Risk Factors Sneezing: no Itchy scratchy throat: no Seasonal symptoms: no  Flu Risk Factors Headache: no muscle aches: no severe fatigue: no   Past Medical History  Diagnosis Date  . 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  . Hyperlipidemia    Past Surgical History  Procedure Laterality Date  . Tubal ligation      bilateral  . Retinal tear repair cryotherapy    . Laminectomy  03-2010    T12 and L1   Family History  Problem Relation Age of Onset  . Parkinsonism      family history  . Diabetes      family history  . Hypertension      family history  . Heart block      family history/cardiovascular disease  . Diabetes Mother   . Coronary artery disease Mother     CABG  . Dementia Father   . Coronary artery disease Sister    History  Substance Use Topics  . Smoking status: Current Every Day Smoker -- 1.00 packs/day for 20 years    Types: Cigarettes  . Smokeless tobacco: Not on file  . Alcohol Use: Yes     Comment: socially   OB History   Grav Para Term Preterm Abortions TAB SAB Ect Mult Living                  Review of Systems  All other systems reviewed and are negative.    Allergies  Erythromycin; Fosamax; and Lisinopril  Home Medications   Current Outpatient Rx  Name  Route  Sig  Dispense  Refill  . albuterol (PROVENTIL HFA;VENTOLIN HFA) 108 (90 BASE) MCG/ACT inhaler   Inhalation   Inhale 2 puffs into the lungs every 6 (six) hours as needed for wheezing or shortness of breath.   1 Inhaler   2   . AMBULATORY NON FORMULARY MEDICATION      Medication Name: Nebulizer for COPD. Albuterol 2.5 mg vial every 4 hoursPRN for shortness of breath, wheezing. #100 vials. One refill.   1 vial   0   . amphetamine-dextroamphetamine (ADDERALL XR) 20 MG 24 hr capsule   Oral   Take 1 capsule (20 mg total) by mouth every morning.   30 capsule   0   . aspirin 81 MG tablet   Oral   Take 81 mg by mouth daily.           Marland Kitchen buPROPion (WELLBUTRIN XL) 300 MG 24 hr tablet   Oral   Take 1 tablet (300 mg total) by mouth daily.  30 tablet   2   . doxycycline (VIBRA-TABS) 100 MG tablet   Oral   Take 1 tablet (100 mg total) by mouth 2 (two) times daily.   20 tablet   0   . ibuprofen (ADVIL,MOTRIN) 200 MG tablet   Oral   Take 400 mg by mouth every 8 (eight) hours as needed.          . lamoTRIgine (LAMICTAL) 100 MG tablet   Oral   Take 1 tablet (100 mg total) by mouth daily.   30 tablet   2   . LORazepam (ATIVAN) 0.5 MG tablet   Oral   Take 1 tablet (0.5 mg total) by mouth daily.   30 tablet   2   . losartan (COZAAR) 25 MG tablet      TAKE 1 TABLET (25 MG TOTAL) BY MOUTH DAILY.   30 tablet   2   . Multiple Vitamin (MULTIVITAMIN) tablet   Oral   Take 1 tablet by mouth daily.           Marland Kitchen omeprazole (PRILOSEC) 20 MG capsule   Oral   Take 20 mg by mouth daily.           . predniSONE (DELTASONE) 20 MG tablet      2 tabs poo QD x 5 days then one a day x 5 days.   15 tablet   0   . ranitidine (ZANTAC) 150 MG tablet   Oral   Take 150 mg by mouth at bedtime.            . sertraline (ZOLOFT) 100 MG tablet   Oral   Take 2 tablets (200 mg total) by mouth daily.   60 tablet   2   . simvastatin (ZOCOR) 80 MG tablet      TAKE 1 TABLET AT BEDTIME   90 tablet   1    BP 160/91  Pulse 105  Temp(Src) 98 F (36.7 C) (Oral)  Resp 24  Ht 5\' 3"  (1.6 m)  Wt 125 lb 8 oz (56.926 kg)  BMI 22.24 kg/m2  SpO2 89% Physical Exam  Constitutional:  Appears older than stated age  HENT:  Head: Normocephalic and atraumatic.  Right Ear: External ear normal.  Left Ear: External ear normal.  +nasal erythema, rhinorrhea bilaterally, + post oropharyngeal erythema    Eyes: Conjunctivae are normal. Pupils are equal, round, and reactive to light.  Neck: Normal range of motion. Neck supple.  Cardiovascular: Normal rate, regular rhythm and normal heart sounds.   Pulmonary/Chest: Effort normal. She has wheezes.  Abdominal: Soft. Bowel sounds are normal.  Musculoskeletal: Normal range of motion.  Neurological: She is alert.  Skin: Skin is warm.    ED Course  Procedures (including critical care time) Labs Review Labs Reviewed - No data to display Imaging Review Dg Chest 2 View  09/15/2013   CLINICAL DATA:  Shortness of Breath  EXAM: CHEST - 2 VIEW  COMPARISON:  02/10/2010  FINDINGS: Moderate cardiomegaly has developed. New central pulmonary vascular congestion. New perihilar and bibasilar interstitial edema or infiltrates. Small bilateral pleural effusions with adjacent atelectasis/ consolidation in the lung bases, right greater than left. Tortuous thoracic aorta. Degenerative changes noted at the thoracolumbar junction.  IMPRESSION: 1. Cardiomegaly, pulmonary vascular congestion ,bibasilar edema/ infiltrates, and small effusions suggesting CHF.   Electronically Signed   By: Oley Balm M.D.   On: 09/15/2013 10:35    EKG Interpretation    Date/Time:  Ventricular Rate:    PR Interval:    QRS Duration:   QT Interval:    QTC Calculation:   R Axis:      Text Interpretation:              MDM   1. URI (upper respiratory infection)   2. COPD exacerbation   3. Hypoxia    Duoneb and solumedrol 125mg  IM x1 on initial arrival.  O2 sats went from 89%-->94% s/p duoneb with noted improvement in aeration.  CXR With concern for bibasilar infiltrates and cardiomegaly  However, pt ambulated with O2 sats at <86% consistently  Concern for acute resp failure with cardiac, respiratory and infectious etiologies (COPD, ?CHF, CAP).  Supplemental O2 placed.  Overall resp status stable currently with no increased WOB.  Plan for pt to go ER for further evaluation of sxs as pt may likely benefit from inpatient admission.  Pt is agreeable to plan.       Doree Albee, MD 09/15/13 1059

## 2013-09-15 NOTE — ED Notes (Signed)
Patient c/o sob, nasal congestion, and tired with exertion x 2 wks; audible wheezing noted. Patient states she has seen pcp and was given AX, prednisone and inhaler with no relief.

## 2013-09-15 NOTE — ED Notes (Signed)
Patient transported to X-ray 

## 2013-09-15 NOTE — ED Notes (Signed)
Patient ambulated with O2 stat O2 dropped from 94% to 86%

## 2013-09-16 DIAGNOSIS — I059 Rheumatic mitral valve disease, unspecified: Secondary | ICD-10-CM

## 2013-09-16 DIAGNOSIS — I5021 Acute systolic (congestive) heart failure: Secondary | ICD-10-CM

## 2013-09-16 LAB — BASIC METABOLIC PANEL
BUN: 18 mg/dL (ref 6–23)
CO2: 26 mEq/L (ref 19–32)
Creatinine, Ser: 1.1 mg/dL (ref 0.50–1.10)
GFR calc non Af Amer: 51 mL/min — ABNORMAL LOW (ref >90)
Glucose, Bld: 104 mg/dL — ABNORMAL HIGH (ref 70–99)
Potassium: 3.4 mEq/L — ABNORMAL LOW (ref 3.5–5.1)
Sodium: 138 mEq/L (ref 135–145)

## 2013-09-16 LAB — TROPONIN I: Troponin I: <0.3 ng/mL (ref <0.30)

## 2013-09-16 MED ORDER — FUROSEMIDE 10 MG/ML IJ SOLN: 40.0000 mg | Freq: Once | INTRAMUSCULAR | Status: AC

## 2013-09-16 MED ORDER — MAGNESIUM OXIDE 400 (241.3 MG) MG PO TABS
400.0000 mg | ORAL_TABLET | Freq: Two times a day (BID) | ORAL | Status: AC
  Administered 2013-09-16 (×2): 400 mg via ORAL
  Filled 2013-09-16 (×2): qty 1

## 2013-09-16 MED ORDER — POTASSIUM CHLORIDE CRYS ER 20 MEQ PO TBCR
40.0000 meq | EXTENDED_RELEASE_TABLET | Freq: Two times a day (BID) | ORAL | Status: AC
  Administered 2013-09-16 (×2): 40 meq via ORAL
  Filled 2013-09-16 (×2): qty 2

## 2013-09-16 MED ORDER — POTASSIUM CHLORIDE CRYS ER 20 MEQ PO TBCR: 40.0000 meq | EXTENDED_RELEASE_TABLET | Freq: Every day | ORAL | Status: AC

## 2013-09-16 NOTE — Progress Notes (Signed)
  Echocardiogram 2D Echocardiogram has been performed.  Arvil Chaco 09/16/2013, 2:23 PM

## 2013-09-16 NOTE — Progress Notes (Signed)
Pt laying bed no concerns, periods of SOB but goes away. Pt requested something for pain and is resting.

## 2013-09-16 NOTE — Progress Notes (Signed)
[facility] PROGRESS NOTE  Assessment/Plan: Acute diastolic heart failure: - Cont IV lasix, good urine output. +JVD, crackles at bases and + S3. Concern about worsening heart function echo pending. - monitor and replete electrolytes. - cardiac markers negative x 3 - cont ACE.  Hypokalemia: - replete K and Mag.  Hypertension - stable, cont lasix and ARB.  MDD (major depressive disorder) - stable cont home meds.    Code Status: full Family Communication: noen  Disposition Plan: inpatient   Consultants:  none  Procedures:  ECHO 11.30.2014  CXR 11.29.2014    Antibiotics:  None  HPI/Subjective: No complains  Objective: Filed Vitals:   09/15/13 1600 09/15/13 2032 09/16/13 0000 09/16/13 0532  BP: 120/102 140/98 123/82 137/90  Pulse: 90 89 93 108  Temp: 98.3 F (36.8 C) 98 F (36.7 C)  98.2 F (36.8 C)  TempSrc: Oral Oral  Oral  Resp: 20 18 18 18   Height: 5\' 3"  (1.6 m)     Weight: 54.159 kg (119 lb 6.4 oz)   53.524 kg (118 lb)  SpO2: 95% 97% 98% 97%    Intake/Output Summary (Last 24 hours) at 09/16/13 0828 Last data filed at 09/16/13 0300  Gross per 24 hour  Intake    540 ml  Output   2425 ml  Net  -1885 ml   Filed Weights   09/15/13 1600 09/16/13 0532  Weight: 54.159 kg (119 lb 6.4 oz) 53.524 kg (118 lb)    Exam:  General: Alert, awake, oriented x3, in no acute distress.  HEENT: No bruits, no goiter. +JVD Heart: Regular rate and rhythm, +S3 Lungs: Good air movement, crackles at bases Abdomen: Soft, nontender, nondistended, positive bowel sounds.  Neuro: Grossly intact, nonfocal.   Data Reviewed: Basic Metabolic Panel:  Recent Labs Lab 09/15/13 1210 09/15/13 1852 09/16/13 0540  NA 142  --  138  K 3.2*  --  3.4*  CL 103  --  100  CO2 24  --  26  GLUCOSE 101*  --  104*  BUN 17  --  18  CREATININE 0.81  --  1.10  CALCIUM 8.5  --  8.5  MG  --  1.8  --    Liver Function Tests:  Recent Labs Lab 09/15/13 1210  AST 20   ALT 12  ALKPHOS 127*  BILITOT 0.4  PROT 6.4  ALBUMIN 3.1*   No results found for this basename: LIPASE, AMYLASE,  in the last 168 hours No results found for this basename: AMMONIA,  in the last 168 hours CBC:  Recent Labs Lab 09/15/13 1210  WBC 9.5  NEUTROABS 8.7*  HGB 11.5*  HCT 35.2*  MCV 88.2  PLT 227   Cardiac Enzymes:  Recent Labs Lab 09/15/13 1852 09/15/13 2218 09/16/13 0540  TROPONINI <0.30 <0.30 <0.30   BNP (last 3 results)  Recent Labs  09/15/13 1210  PROBNP 12210.0*   CBG: No results found for this basename: GLUCAP,  in the last 168 hours  No results found for this or any previous visit (from the past 240 hour(s)).   Studies: Dg Chest 2 View  09/15/2013   CLINICAL DATA:  Shortness of Breath  EXAM: CHEST - 2 VIEW  COMPARISON:  Earlier films of the same day  FINDINGS: Stable cardiomegaly. Pulmonary vascular congestion with some increase in mild interstitial edema since previous study, septal lines seen now peripherally in both lung bases. Suspect tiny pleural effusions as before, with adjacent atelectasis/ infiltrate in the lung bases, right  greater than left. Spondylitic changes at thoracolumbar junction. . Tortuous thoracic aorta.  IMPRESSION: Stable cardiomegaly and effusions with slight worsening of bilateral interstitial edema   Electronically Signed   By: Oley Balm M.D.   On: 09/15/2013 13:56   Dg Chest 2 View  09/15/2013   CLINICAL DATA:  Shortness of Breath  EXAM: CHEST - 2 VIEW  COMPARISON:  02/10/2010  FINDINGS: Moderate cardiomegaly has developed. New central pulmonary vascular congestion. New perihilar and bibasilar interstitial edema or infiltrates. Small bilateral pleural effusions with adjacent atelectasis/ consolidation in the lung bases, right greater than left. Tortuous thoracic aorta. Degenerative changes noted at the thoracolumbar junction.  IMPRESSION: 1. Cardiomegaly, pulmonary vascular congestion ,bibasilar edema/ infiltrates, and  small effusions suggesting CHF.   Electronically Signed   By: Oley Balm M.D.   On: 09/15/2013 10:35    Scheduled Meds: . ipratropium  0.5 mg Nebulization Q6H   And  . albuterol  2.5 mg Nebulization Q6H  . aspirin EC  81 mg Oral Daily  . atorvastatin  40 mg Oral q1800  . buPROPion  300 mg Oral Daily  . enoxaparin (LOVENOX) injection  40 mg Subcutaneous Q24H  . famotidine  20 mg Oral Daily  . furosemide  40 mg Intravenous BID  . lamoTRIgine  100 mg Oral Daily  . losartan  25 mg Oral Daily  . sertraline  200 mg Oral Daily  . sodium chloride  3 mL Intravenous Q12H   Continuous Infusions:    Marinda Elk  [facility] Pager [phone].  If 8PM-8AM, please contact night-coverage at [email or web address], password [facility] 09/16/2013, 8:28 AM  LOS: 1 day

## 2013-09-16 NOTE — Progress Notes (Signed)
Pt daughter concerned about pt going home without assistance and would like pt to do SNF for a short period of time. Pt was unsure if she would be interested in SNF. Spoke with p.t and daughter about concerns. Will call MD regarding a PT/OT eval.

## 2013-09-16 NOTE — Progress Notes (Signed)
Spoke with pt Annabell Howells (daughter) outside of the room, she states the patients house is an unlivable state and she very concerned about her going home. She has limited access to her bathroom and kitchen. Would like to speak with the social worker and MD prior to pts discharge. As she is concerned with pts. Ability to afford medication, d/t her limited  income.

## 2013-09-16 NOTE — Progress Notes (Signed)
Utilization Review Completed.Alicia Aguilar T11/30/2014  

## 2013-09-17 ENCOUNTER — Other Ambulatory Visit: Payer: Self-pay | Admitting: Family Medicine

## 2013-09-17 ENCOUNTER — Encounter (HOSPITAL_COMMUNITY): Payer: Self-pay | Admitting: Physician Assistant

## 2013-09-17 DIAGNOSIS — R011 Cardiac murmur, unspecified: Secondary | ICD-10-CM

## 2013-09-17 DIAGNOSIS — J4489 Other specified chronic obstructive pulmonary disease: Secondary | ICD-10-CM

## 2013-09-17 DIAGNOSIS — I340 Nonrheumatic mitral (valve) insufficiency: Secondary | ICD-10-CM | POA: Diagnosis present

## 2013-09-17 DIAGNOSIS — I359 Nonrheumatic aortic valve disorder, unspecified: Secondary | ICD-10-CM

## 2013-09-17 DIAGNOSIS — I351 Nonrheumatic aortic (valve) insufficiency: Secondary | ICD-10-CM | POA: Diagnosis present

## 2013-09-17 DIAGNOSIS — J449 Chronic obstructive pulmonary disease, unspecified: Secondary | ICD-10-CM

## 2013-09-17 DIAGNOSIS — I059 Rheumatic mitral valve disease, unspecified: Secondary | ICD-10-CM

## 2013-09-17 DIAGNOSIS — I509 Heart failure, unspecified: Secondary | ICD-10-CM

## 2013-09-17 DIAGNOSIS — I5041 Acute combined systolic (congestive) and diastolic (congestive) heart failure: Principal | ICD-10-CM

## 2013-09-17 LAB — BASIC METABOLIC PANEL
BUN: 15 mg/dL (ref 6–23)
Calcium: 8.9 mg/dL (ref 8.4–10.5)
Chloride: 104 mEq/L (ref 96–112)
GFR calc Af Amer: 75 mL/min — ABNORMAL LOW (ref >90)
Potassium: 4 mEq/L (ref 3.5–5.1)

## 2013-09-17 LAB — PROTIME-INR
INR: 1.08 (ref 0.00–1.49)
Prothrombin Time: 13.8 seconds (ref 11.6–15.2)

## 2013-09-17 MED ORDER — FUROSEMIDE 10 MG/ML IJ SOLN: 40.0000 mg | Freq: Two times a day (BID) | INTRAMUSCULAR | Status: DC

## 2013-09-17 MED ORDER — ALBUTEROL SULFATE (5 MG/ML) 0.5% IN NEBU
2.5000 mg | INHALATION_SOLUTION | Freq: Two times a day (BID) | RESPIRATORY_TRACT | Status: DC
  Administered 2013-09-17 – 2013-09-20 (×5): 2.5 mg via RESPIRATORY_TRACT
  Filled 2013-09-17 (×9): qty 0.5

## 2013-09-17 MED ORDER — FUROSEMIDE 10 MG/ML IJ SOLN
40.0000 mg | Freq: Once | INTRAMUSCULAR | Status: AC
  Administered 2013-09-19: 13:00:00 40 mg via INTRAVENOUS
  Filled 2013-09-17: qty 4

## 2013-09-17 MED ORDER — FUROSEMIDE 20 MG PO TABS
20.0000 mg | ORAL_TABLET | Freq: Every day | ORAL | Status: DC
  Filled 2013-09-17: qty 1

## 2013-09-17 MED ORDER — FUROSEMIDE 20 MG PO TABS: 20.0000 mg | ORAL_TABLET | Freq: Every day | ORAL | Status: DC

## 2013-09-17 MED ORDER — SODIUM CHLORIDE 0.9 % IV SOLN: INTRAVENOUS | Status: DC

## 2013-09-17 MED ORDER — IPRATROPIUM BROMIDE 0.02 % IN SOLN
0.5000 mg | Freq: Two times a day (BID) | RESPIRATORY_TRACT | Status: DC
  Administered 2013-09-17 – 2013-09-20 (×5): 0.5 mg via RESPIRATORY_TRACT
  Filled 2013-09-17 (×9): qty 2.5

## 2013-09-17 MED ORDER — ASPIRIN 81 MG PO CHEW
81.0000 mg | CHEWABLE_TABLET | ORAL | Status: AC
  Administered 2013-09-18: 81 mg via ORAL
  Filled 2013-09-17: qty 1

## 2013-09-17 MED ORDER — FUROSEMIDE 20 MG PO TABS
20.0000 mg | ORAL_TABLET | Freq: Two times a day (BID) | ORAL | Status: DC
  Filled 2013-09-17 (×2): qty 1

## 2013-09-17 NOTE — Progress Notes (Signed)
Per d/w Dr. Tenny Craw, plan to continue IV Lasix through tonight. Will reassess status in AM for further doses and fitness for cath. She is tentatively on the board for r/lhc tomorrow. Dayna Dunn PA-C

## 2013-09-17 NOTE — Consult Note (Signed)
 Cardiology Consultation Note  Patient ID: Alicia Aguilar, MRN: [id number], DOB/AGE: 67/10/1945 67 y.o. Admit date: 09/15/2013   Date of Consult: 09/17/2013 Primary Physician: METHENEY,CATHERINE, MD Primary Cardiologist: McAlhany  Chief Complaint: SOB Reason for Consult: LV dysfunction/CHF, mod-severe MR, moderate AI  HPI: Alicia Aguilar is a 67 y/o F with history of COPD, depression, prior valvular disease (by echo done by PCP 03/2012) who presented to [facility] 09/15/2013 with 2 weeks of SOB. She previously saw Dr. McAlhany in 2011 for leg pain with normal noninvasive PV studies at that time. Previous echo done for murmur by PCP in 03/2012 showed normal EF, mod AI, mild MR. She thinks she had a very remote heart cath but I am unable to find records of this.  She has had exertional dyspnea for several months. However, this has gotten gradually worse over the last 3 weeks. She was seen by PCP on 08/27/13 and placed on steroids, doxycycline and prednisone for COPD exacerbation with initial improvement. However, she then began to develop worsening DOE accompanied by chest pressure, waxing/waning LEE, orthopnea. She got to the point where she could barely walk 10 feet without gasping for breath. Denies fever, syncope, weight change. She had not had any chest pain at rest. On day of admission 09/14/13, she initally went to urgent care received steroid bolus and inhaled albuterol. This improved her condition substantially but she was reportedly hypoxic when ambulating, requiring O2 even after treatment. She was sent to the ED where she was found to have LE edema, pBNP of 12k, CXR concerning for bilateral effusions and interstitial edema. Troponins all negative. She was admitted for CHF and placed on 40mg IV Lasix BID with -5.4L UOP and -7lb weight. SOB is improving per patient report but not yet totally back to baseline. She hasn't had any further chest pressure since the morning of admission. 2D Echo  was performed yesterday showed EF 40-45% with mild diffuse HK as well as +RWMA as below, grade 2 diastolic dysfunction, mod AI, mod-severe MR, massively dilated LA, PA pressure 41mmHg. She denies history of rheumatic fever.  Past Medical History  Diagnosis Date  . Leg pain     no pvd - seen by Dr. McAlhany with normal ABI. 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  . Hyperlipidemia   . COPD (chronic obstructive pulmonary disease)   . Hypertension   . Mitral regurgitation   . Aortic regurgitation   . Major depressive disorder   . Carotid artery disease     a. Carotid dopplers 03/2012: Atherosclerotic disease at the carotid bulbs, right side greater than left. Estimated degree of stenosis in the internal carotid arteries is less than 50% bilaterally.  . ADHD (attention deficit hyperactivity disorder)       Most Recent Cardiac Studies: 2D Echo 09/16/13 - Left ventricle: The cavity size was normal. Wall thickness was normal. Systolic function was mildly to moderately reduced. The estimated ejection fraction was in the range of 40% to 45%. Mild diffuse hypokinesis. Features are consistent with a pseudonormal left ventricular filling pattern, with concomitant abnormal relaxation and increased filling pressure (grade 2 diastolic dysfunction). Doppler parameters are consistent with both elevated ventricular end-diastolic filling pressure and elevated left atrial filling pressure. - Regional wall motion abnormality: Hypokinesis of the mid-apical anterior, mid anteroseptal, mid inferoseptal, mid-apical inferior, and apical myocardium. - Aortic valve: Mild focal calcification, nodularity, and sclerosis without stenosis involving the right coronary cusp predominantly. Moderate regurgitation. - Mitral valve: Moderate   to severe regurgitation. - Left atrium: The atrium was massively dilated. - Right atrium: The atrium was mildly dilated. - Atrial septum: There was increased  thickness of the septum, consistent with lipomatous hypertrophy. - Pulmonary arteries: Systolic pressure was moderately increased. PA peak pressure: 41mm Hg (S). Impressions: - Left ventricular dysfunction, with elevated left-sided pressure and reduced cardiac output. The dysfunction is both systolic and diastolic. Borderline significant Aortic & Mitral regurgitation. Recommendations: 1. Consider transesophageal echocardiography if clinically indicated. 2. Will review with [facility] Cardiologists.  2D Echo 03/2012 - previous - conveyed to patient by PCP - Left ventricle: The cavity size was normal. Wall thickness was increased in a pattern of mild LVH. Systolic function was normal. The estimated ejection fraction was in the range of 55% to 60%. Wall motion was normal; there were no regional wall motion abnormalities. Doppler parameters are consistent with abnormal left ventricular relaxation (grade 1 diastolic dysfunction). - Aortic valve: Moderate regurgitation. - Mitral valve: Mild regurgitation.  Carotid dopplers 03/2012: Atherosclerotic disease at the carotid bulbs, right side greater than left. Estimated degree of stenosis in the internal carotid arteries is less than 50% bilaterally.   Surgical History:  Past Surgical History  Procedure Laterality Date  . Tubal ligation      bilateral  . Retinal tear repair cryotherapy    . Laminectomy  03-2010    T12 and L1     Home Meds: Prior to Admission medications   Medication Sig Start Date End Date Taking? Authorizing Provider  albuterol (PROVENTIL HFA;VENTOLIN HFA) 108 (90 BASE) MCG/ACT inhaler Inhale 2 puffs into the lungs every 6 (six) hours as needed for wheezing or shortness of breath. 08/27/13 08/27/14 Yes Catherine D Metheney, MD  aspirin 81 MG tablet Take 81 mg by mouth daily.     Yes Historical Provider, MD  buPROPion (WELLBUTRIN XL) 300 MG 24 hr tablet Take 1 tablet (300 mg total) by mouth daily. 06/12/13  Yes Mary Patricia Moore,  MD  Glycerin-Polysorbate 80 (REFRESH DRY EYE THERAPY OP) Place 2 drops into both eyes daily as needed (dry eyes).   Yes Historical Provider, MD  ibuprofen (ADVIL,MOTRIN) 200 MG tablet Take 400 mg by mouth every 4 (four) hours as needed for moderate pain.    Yes Historical Provider, MD  lamoTRIgine (LAMICTAL) 100 MG tablet Take 1 tablet (100 mg total) by mouth daily. 06/12/13  Yes Mary Patricia Moore, MD  LORazepam (ATIVAN) 0.5 MG tablet Take 0.5 mg by mouth daily as needed for anxiety.   Yes Historical Provider, MD  losartan (COZAAR) 25 MG tablet Take 25 mg by mouth daily.   Yes Historical Provider, MD  omeprazole (PRILOSEC) 20 MG capsule Take 20 mg by mouth daily.     Yes Historical Provider, MD  ranitidine (ZANTAC) 150 MG tablet Take 150 mg by mouth at bedtime.     Yes Historical Provider, MD  sertraline (ZOLOFT) 100 MG tablet Take 2 tablets (200 mg total) by mouth daily. 06/12/13  Yes Mary Patricia Moore, MD  simvastatin (ZOCOR) 80 MG tablet Take 80 mg by mouth at bedtime.   Yes Historical Provider, MD  furosemide (LASIX) 20 MG tablet Take 1 tablet (20 mg total) by mouth daily. 09/17/13   Abraham Feliz Ortiz, MD    Inpatient Medications:  . ipratropium  0.5 mg Nebulization BID   And  . albuterol  2.5 mg Nebulization BID  . aspirin EC  81 mg Oral Daily  . atorvastatin  40 mg Oral q1800  .   buPROPion  300 mg Oral Daily  . enoxaparin (LOVENOX) injection  40 mg Subcutaneous Q24H  . famotidine  20 mg Oral Daily  . furosemide  40 mg Intravenous BID  . lamoTRIgine  100 mg Oral Daily  . losartan  25 mg Oral Daily  . sertraline  200 mg Oral Daily  . sodium chloride  3 mL Intravenous Q12H      Allergies:  Allergies  Allergen Reactions  . Fosamax [Alendronate Sodium] Other (See Comments)    Acid reflux  . Lisinopril Cough  . Erythromycin Rash    History   Social History  . Marital Status: Divorced    Spouse Name: N/A    Number of Children: N/A  . Years of Education: N/A    Occupational History  . Not on file.   Social History Main Topics  . Smoking status: Current Every Day Smoker -- 1.00 packs/day for 30 years    Types: Cigarettes  . Smokeless tobacco: Not on file  . Alcohol Use: Yes     Comment: socially - only at holidays  . Drug Use: No  . Sexual Activity: No     Comment: [employer], retired, divorced, 2 children, no regular exercise, 2-4 caffeine drinks daily.   Other Topics Concern  . Not on file   Social History Narrative  . No narrative on file     Family History  Problem Relation Age of Onset  . Parkinsonism      family history  . Diabetes      family history  . Hypertension      family history  . Heart block      family history/cardiovascular disease  . Diabetes Mother   . Coronary artery disease Mother     CABG - early 60s - at least 6 heart attacks  . Dementia Father   . Coronary artery disease Sister     some kind of operation, pt unsure     Review of Systems: General: negative for chills, fever, night sweats. Denies weight change or clothes fitting differently. Cardiovascular: see above, otherwise negative Dermatological: negative for rash Respiratory: + occ wheezing Urologic: negative for hematuria Abdominal: negative for nausea, vomiting, diarrhea, bright red blood per rectum, melena, or hematemesis Neurologic: negative for visual changes, syncope. Occasional dizziness i.e. Every 9 months  All other systems reviewed and are otherwise negative except as noted above.  Labs:  Recent Labs  09/15/13 1852 09/15/13 2218 09/16/13 0540  TROPONINI <0.30 <0.30 <0.30   Lab Results  Component Value Date   WBC 9.5 09/15/2013   HGB 11.5* 09/15/2013   HCT 35.2* 09/15/2013   MCV 88.2 09/15/2013   PLT 227 09/15/2013    Recent Labs Lab 09/15/13 1210  09/17/13 0551  NA 142  < > 142  K 3.2*  < > 4.0  CL 103  < > 104  CO2 24  < > 32  BUN 17  < > 15  CREATININE 0.81  < > 0.90  CALCIUM 8.5  < > 8.9  PROT 6.4  --    --   BILITOT 0.4  --   --   ALKPHOS 127*  --   --   ALT 12  --   --   AST 20  --   --   GLUCOSE 101*  < > 102*  < > = values in this interval not displayed.  Radiology/Studies:  Dg Chest 2 View  09/15/2013   CLINICAL DATA:  Shortness   of Breath  EXAM: CHEST - 2 VIEW  COMPARISON:  Earlier films of the same day  FINDINGS: Stable cardiomegaly. Pulmonary vascular congestion with some increase in mild interstitial edema since previous study, septal lines seen now peripherally in both lung bases. Suspect tiny pleural effusions as before, with adjacent atelectasis/ infiltrate in the lung bases, right greater than left. Spondylitic changes at thoracolumbar junction. . Tortuous thoracic aorta.  IMPRESSION: Stable cardiomegaly and effusions with slight worsening of bilateral interstitial edema   Electronically Signed   By: Daniel  Hassell M.D.   On: 09/15/2013 13:56   Dg Chest 2 View  09/15/2013   CLINICAL DATA:  Shortness of Breath  EXAM: CHEST - 2 VIEW  COMPARISON:  02/10/2010  FINDINGS: Moderate cardiomegaly has developed. New central pulmonary vascular congestion. New perihilar and bibasilar interstitial edema or infiltrates. Small bilateral pleural effusions with adjacent atelectasis/ consolidation in the lung bases, right greater than left. Tortuous thoracic aorta. Degenerative changes noted at the thoracolumbar junction.  IMPRESSION: 1. Cardiomegaly, pulmonary vascular congestion ,bibasilar edema/ infiltrates, and small effusions suggesting CHF.   Electronically Signed   By: Daniel  Hassell M.D.   On: 09/15/2013 10:35   EKG: sinus tach probable LAA, LVH, borderline low voltage extremity leads, baseline wander inferior leads, subtle ST depression V6.  Physical Exam: Blood pressure 101/64, pulse 85, temperature 98.9 F (37.2 C), temperature source Oral, resp. rate 18, height 5' 3" (1.6 m), weight 112 lb (50.803 kg), SpO2 92.00%. General: Well developed, well nourished WF in no acute distress. Head:  Normocephalic, atraumatic, sclera non-icteric, no xanthomas, nares are without discharge.  Neck: Negative for carotid bruits. JVD not elevated. Lungs: Bilateral crackles at bases. No wheezes or rhonchi. Breathing is unlabored. Heart: RRR with S1 S2. Soft murmur at apex. No rubs or gallops appreciated. Abdomen: Soft, non-tender, non-distended with normoactive bowel sounds. No hepatomegaly. No rebound/guarding. No obvious abdominal masses. Msk:  Strength and tone appear normal for age. Extremities: No clubbing or cyanosis. No edema.  Distal pedal pulses are 2+ and equal bilaterally. Neuro: Alert and oriented X 3. No facial asymmetry. No focal deficit. Moves all extremities spontaneously. Psych:  Responds to questions appropriately with a normal affect.   Assessment and Plan:   1. 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 Moderate-severe MR 3. Moderate AI 4. HTN 5. COPD with ongoing tobacco abuse 6. Hyperlipidemia 7. Depression  LVEF is also now down with +WMA. She reports a history of exertional chest pressure accompanying the SOB. She has ruled out. With cardiac risk factors of ongoing tobacco abuse, HTN, HL, family history, would recomm a cardiac cath R/L) to evaluate for CAD and evaluate pulmonary pressues, assess for V wave. .  Continue ASA, Cozaar, IV Lasix, statin. BP is slightly slow so will hold off on adding beta blocker while we are diuresing. Check lipid panel in AM. Tobacco cessation encouraged.  Signed, Dayna Dunn PA-C 09/17/2013, 4:59 PM   Patient seen and examined  I have amended note to reflect my findings.  Patient with progressive dyspnea.  On presentation had evid of CHF  Diuresing well.  Echo with LV dysfunction and evid of signif MI and AI Would recomm R/L heart cath and TEE after.      

## 2013-09-17 NOTE — Progress Notes (Signed)
BP 90/60 and HR is 95. Patient is due for IV lasix. MD notified. OK to hold medication. No signs or symptoms of distress or discomfort and no verbal complaints. Will continue to monitor patient for further changes in condition.

## 2013-09-17 NOTE — Progress Notes (Signed)
[facility] PROGRESS NOTE  Assessment/Plan: Acute diastolic heart failure: - Change  Lasix to orals, good urine output. No JVD, - ECHO pending. - monitor and replete electrolytes. - cardiac markers negative x 3 - cont ACE.  Hypokalemia: - replete K and Mag.  Hypertension - stable, cont lasix and ARB.  MDD (major depressive disorder) - stable cont home meds.    Code Status: full Family Communication: noen  Disposition Plan: inpatient   Consultants:  none  Procedures:  ECHO 11.30.2014  CXR 11.29.2014    Antibiotics:  None  HPI/Subjective: No complains  Objective: Filed Vitals:   09/17/13 0457 09/17/13 0627 09/17/13 0829 09/17/13 0951  BP:  120/70  118/55  Pulse:  99 97 98  Temp:  98 F (36.7 C)    TempSrc:  Oral    Resp:  17 18   Height:      Weight: 50.803 kg (112 lb)     SpO2:  95% 97%     Intake/Output Summary (Last 24 hours) at 09/17/13 1155 Last data filed at 09/17/13 0951  Gross per 24 hour  Intake    883 ml  Output   2875 ml  Net  -1992 ml   Filed Weights   09/15/13 1600 09/16/13 0532 09/17/13 0457  Weight: 54.159 kg (119 lb 6.4 oz) 53.524 kg (118 lb) 50.803 kg (112 lb)    Exam:  General: Alert, awake, oriented x3, in no acute distress.  HEENT: No bruits, no goiter. Negative JVD. Heart: Regular rate and rhythm, +S3 Lungs: Good air movement, crackles at bases Abdomen: Soft, nontender, nondistended, positive bowel sounds.  Neuro: Grossly intact, nonfocal.   Data Reviewed: Basic Metabolic Panel:  Recent Labs Lab 09/15/13 1210 09/15/13 1852 09/16/13 0540 09/17/13 0551  NA 142  --  138 142  K 3.2*  --  3.4* 4.0  CL 103  --  100 104  CO2 24  --  26 32  GLUCOSE 101*  --  104* 102*  BUN 17  --  18 15  CREATININE 0.81  --  1.10 0.90  CALCIUM 8.5  --  8.5 8.9  MG  --  1.8  --   --    Liver Function Tests:  Recent Labs Lab 09/15/13 1210  AST 20  ALT 12  ALKPHOS 127*  BILITOT 0.4  PROT 6.4  ALBUMIN 3.1*    No results found for this basename: LIPASE, AMYLASE,  in the last 168 hours No results found for this basename: AMMONIA,  in the last 168 hours CBC:  Recent Labs Lab 09/15/13 1210  WBC 9.5  NEUTROABS 8.7*  HGB 11.5*  HCT 35.2*  MCV 88.2  PLT 227   Cardiac Enzymes:  Recent Labs Lab 09/15/13 1852 09/15/13 2218 09/16/13 0540  TROPONINI <0.30 <0.30 <0.30   BNP (last 3 results)  Recent Labs  09/15/13 1210  PROBNP 12210.0*   CBG: No results found for this basename: GLUCAP,  in the last 168 hours  No results found for this or any previous visit (from the past 240 hour(s)).   Studies: Dg Chest 2 View  09/15/2013   CLINICAL DATA:  Shortness of Breath  EXAM: CHEST - 2 VIEW  COMPARISON:  Earlier films of the same day  FINDINGS: Stable cardiomegaly. Pulmonary vascular congestion with some increase in mild interstitial edema since previous study, septal lines seen now peripherally in both lung bases. Suspect tiny pleural effusions as before, with adjacent atelectasis/ infiltrate in the lung bases, right greater  than left. Spondylitic changes at thoracolumbar junction. . Tortuous thoracic aorta.  IMPRESSION: Stable cardiomegaly and effusions with slight worsening of bilateral interstitial edema   Electronically Signed   By: Oley Balm M.D.   On: 09/15/2013 13:56    Scheduled Meds: . ipratropium  0.5 mg Nebulization Q6H   And  . albuterol  2.5 mg Nebulization Q6H  . aspirin EC  81 mg Oral Daily  . atorvastatin  40 mg Oral q1800  . buPROPion  300 mg Oral Daily  . enoxaparin (LOVENOX) injection  40 mg Subcutaneous Q24H  . famotidine  20 mg Oral Daily  . furosemide  40 mg Intravenous BID  . lamoTRIgine  100 mg Oral Daily  . losartan  25 mg Oral Daily  . sertraline  200 mg Oral Daily  . sodium chloride  3 mL Intravenous Q12H   Continuous Infusions:    Marinda Elk  [facility] Pager [phone].  If 8PM-8AM, please contact night-coverage at  [email or web address], password [facility] 09/17/2013, 11:55 AM  LOS: 2 days

## 2013-09-18 ENCOUNTER — Ambulatory Visit (HOSPITAL_COMMUNITY): Payer: Self-pay | Admitting: Psychiatry

## 2013-09-18 ENCOUNTER — Encounter (HOSPITAL_COMMUNITY)
Admission: EM | Disposition: A | Discharge status: Skilled Nursing Facility | Payer: Self-pay | Source: Home / Self Care | Attending: Internal Medicine

## 2013-09-18 DIAGNOSIS — I059 Rheumatic mitral valve disease, unspecified: Secondary | ICD-10-CM

## 2013-09-18 DIAGNOSIS — I2510 Atherosclerotic heart disease of native coronary artery without angina pectoris: Secondary | ICD-10-CM

## 2013-09-18 DIAGNOSIS — I340 Nonrheumatic mitral (valve) insufficiency: Secondary | ICD-10-CM | POA: Diagnosis present

## 2013-09-18 HISTORY — PX: LEFT AND RIGHT HEART CATHETERIZATION WITH CORONARY ANGIOGRAM: SHX5449

## 2013-09-18 LAB — CBC
Hemoglobin: 11.9 g/dL — ABNORMAL LOW (ref 12.0–15.0)
MCH: 28.7 pg (ref 26.0–34.0)
MCV: 92.8 fL (ref 78.0–100.0)
Platelets: 253 10*3/uL (ref 150–400)
RBC: 4.14 MIL/uL (ref 3.87–5.11)
WBC: 8.1 10*3/uL (ref 4.0–10.5)

## 2013-09-18 LAB — POCT I-STAT 3, ART BLOOD GAS (G3+)
Acid-Base Excess: 2 mmol/L (ref 0.0–2.0)
Bicarbonate: 26.5 mEq/L — ABNORMAL HIGH (ref 20.0–24.0)
O2 Saturation: 93 %
pH, Arterial: 7.433 (ref 7.350–7.450)

## 2013-09-18 LAB — POCT I-STAT 3, VENOUS BLOOD GAS (G3P V)
Bicarbonate: 24.8 mEq/L — ABNORMAL HIGH (ref 20.0–24.0)
pH, Ven: 7.386 — ABNORMAL HIGH (ref 7.250–7.300)
pO2, Ven: 30 mmHg (ref 30.0–45.0)

## 2013-09-18 LAB — BASIC METABOLIC PANEL
CO2: 29 mEq/L (ref 19–32)
Chloride: 101 mEq/L (ref 96–112)
Creatinine, Ser: 1.16 mg/dL — ABNORMAL HIGH (ref 0.50–1.10)
GFR calc non Af Amer: 48 mL/min — ABNORMAL LOW (ref >90)
Sodium: 141 mEq/L (ref 135–145)

## 2013-09-18 LAB — LIPID PANEL
Cholesterol: 183 mg/dL (ref 0–200)
HDL: 66 mg/dL (ref >39)
Triglycerides: 149 mg/dL (ref <150)
VLDL: 30 mg/dL (ref 0–40)

## 2013-09-18 SURGERY — LEFT AND RIGHT HEART CATHETERIZATION WITH CORONARY ANGIOGRAM
Anesthesia: LOCAL

## 2013-09-18 MED ORDER — OXYCODONE HCL 5 MG PO TABS
5.0000 mg | ORAL_TABLET | ORAL | Status: DC | PRN
  Administered 2013-09-18 – 2013-09-20 (×4): 5 mg via ORAL
  Filled 2013-09-18 (×4): qty 1

## 2013-09-18 MED ORDER — FENTANYL CITRATE 0.05 MG/ML IJ SOLN
INTRAMUSCULAR | Status: AC
  Filled 2013-09-18: qty 2

## 2013-09-18 MED ORDER — HEPARIN (PORCINE) IN NACL 2-0.9 UNIT/ML-% IJ SOLN
INTRAMUSCULAR | Status: AC
  Filled 2013-09-18: qty 1000

## 2013-09-18 MED ORDER — LIDOCAINE HCL (PF) 1 % IJ SOLN
INTRAMUSCULAR | Status: AC
  Filled 2013-09-18: qty 30

## 2013-09-18 MED ORDER — ONDANSETRON HCL 4 MG/2ML IJ SOLN: 4.0000 mg | Freq: Four times a day (QID) | INTRAMUSCULAR | Status: DC | PRN

## 2013-09-18 MED ORDER — MIDAZOLAM HCL 2 MG/2ML IJ SOLN
INTRAMUSCULAR | Status: AC
  Filled 2013-09-18: qty 2

## 2013-09-18 MED ORDER — ONDANSETRON HCL 4 MG PO TABS: 4.0000 mg | ORAL_TABLET | ORAL | Status: DC | PRN

## 2013-09-18 MED ORDER — SODIUM CHLORIDE 0.9 % IV SOLN: INTRAVENOUS | Status: AC

## 2013-09-18 NOTE — CV Procedure (Signed)
   Cardiac Catheterization Procedure Note  Name: Alicia Aguilar MRN: [id number] DOB: [birth year]  Procedure: Right Heart Cath, Left Heart Cath, Selective Coronary Angiography, LV angiography  Indication:  Mitral regurgitation.  Cardiomyopathy.    Procedural Details: The right groin was prepped, draped, and anesthetized with 1% lidocaine. Using the modified Seldinger technique a 5 French sheath was placed in the right femoral artery and a 7 French sheath was placed in the right femoral vein. A Swan-Ganz catheter was used for the right heart catheterization. Standard protocol was followed for recording of right heart pressures and sampling of oxygen saturations. Fick cardiac output was calculated. Standard Judkins catheters were used for selective coronary angiography and left ventriculography. There were no immediate procedural complications. The patient was transferred to the post catheterization recovery area for further monitoring.  Procedural Findings: Hemodynamics:               RA 4    RV 28/1    PA 30/11 mean 19    PCWP 8    LV 138/3    AO 135/78   Oxygen saturations:    PA 57    AO 93   Cardiac Output (Fick) 3.45                               Cardiac Index (Fick) 2.28   Coronary angiography:  Coronary dominance: right  Left mainstem: Normal  Left anterior descending (LAD): Normal.  D1 large and normal.    Left circumflex (LCx): AV groove normal.  RI moderate sized and normal.  Large MOM normal  Right coronary artery (RCA): Dominant but somewhat small.  Long mid 40%.  AM large and normal.  PDA off the AM normal.  PL x 2 small and normal  Left ventriculography: Left ventricular systolic function is 30% with global hypokinesis.  MR probably severe.  Final Conclusions:  Nonischemic cardiomyopathy with mitral regurgitation as above.  Mild nonobstructive coronary disease.   Recommendations: Plan will be TEE to further evaluate the MR.     [name] 09/18/2013, 8:37  AM

## 2013-09-18 NOTE — H&P (View-Only) (Signed)
Cardiology Consultation Note  Patient ID: Alicia Aguilar, MRN: [id number], DOB/AGE: 67-Oct-1947 67 y.o. Admit date: 09/15/2013   Date of Consult: 09/17/2013 Primary Physician: Alicia Gasser, MD Primary Cardiologist: Alicia Aguilar  Chief Complaint: SOB Reason for Consult: LV dysfunction/CHF, mod-severe MR, moderate AI  HPI: Alicia Aguilar is a 67 y/o F with history of COPD, depression, prior valvular disease (by echo done by PCP 03/2012) who presented to [facility] 09/15/2013 with 2 weeks of SOB. She previously saw Dr. Clifton Aguilar in 2011 for leg pain with normal noninvasive PV studies at that time. Previous echo done for murmur by PCP in 03/2012 showed normal EF, mod AI, mild MR. She thinks she had a very remote heart cath but I am unable to find records of this.  She has had exertional dyspnea for several months. However, this has gotten gradually worse over the last 3 weeks. She was seen by PCP on 08/27/13 and placed on steroids, doxycycline and prednisone for COPD exacerbation with initial improvement. However, she then began to develop worsening DOE accompanied by chest pressure, waxing/waning LEE, orthopnea. She got to the point where she could barely walk 10 feet without gasping for breath. Denies fever, syncope, weight change. She had not had any chest pain at rest. On day of admission 09/14/13, she initally went to urgent care received steroid bolus and inhaled albuterol. This improved her condition substantially but she was reportedly hypoxic when ambulating, requiring O2 even after treatment. She was sent to the ED where she was found to have LE edema, pBNP of 12k, CXR concerning for bilateral effusions and interstitial edema. Troponins all negative. She was admitted for CHF and placed on 40mg  IV Lasix BID with -5.4L UOP and -7lb weight. SOB is improving per patient report but not yet totally back to baseline. She hasn't had any further chest pressure since the morning of admission. 2D Echo  was performed yesterday showed EF 40-45% with mild diffuse HK as well as +RWMA as below, grade 2 diastolic dysfunction, mod AI, mod-severe MR, massively dilated LA, PA pressure . She denies history of rheumatic fever.  Past Medical History  Diagnosis Date  . Leg pain     no pvd - seen by Dr. Clifton Aguilar with normal ABI. 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  . Hyperlipidemia   . COPD (chronic obstructive pulmonary disease)   . Hypertension   . Mitral regurgitation   . Aortic regurgitation   . Major depressive disorder   . Carotid artery disease     a. Carotid dopplers 03/2012: Atherosclerotic disease at the carotid bulbs, right side greater than left. Estimated degree of stenosis in the internal carotid arteries is less than 50% bilaterally.  . ADHD (attention deficit hyperactivity disorder)       Most Recent Cardiac Studies: 2D Echo 09/16/13 - Left ventricle: The cavity size was normal. Wall thickness was normal. Systolic function was mildly to moderately reduced. The estimated ejection fraction was in the range of 40% to 45%. Mild diffuse hypokinesis. Features are consistent with a pseudonormal left ventricular filling pattern, with concomitant abnormal relaxation and increased filling pressure (grade 2 diastolic dysfunction). Doppler parameters are consistent with both elevated ventricular end-diastolic filling pressure and elevated left atrial filling pressure. - Regional wall motion abnormality: Hypokinesis of the mid-apical anterior, mid anteroseptal, mid inferoseptal, mid-apical inferior, and apical myocardium. - Aortic valve: Mild focal calcification, nodularity, and sclerosis without stenosis involving the right coronary cusp predominantly. Moderate regurgitation. - Mitral valve: Moderate  to severe regurgitation. - Left atrium: The atrium was massively dilated. - Right atrium: The atrium was mildly dilated. - Atrial septum: There was increased  thickness of the septum, consistent with lipomatous hypertrophy. - Pulmonary arteries: Systolic pressure was moderately increased. PA peak pressure: 41mm Hg (S). Impressions: - Left ventricular dysfunction, with elevated left-sided pressure and reduced cardiac output. The dysfunction is both systolic and diastolic. Borderline significant Aortic & Mitral regurgitation. Recommendations: 1. Consider transesophageal echocardiography if clinically indicated. 2. Will review with TEE Cardiologists.  2D Echo 03/2012 - previous - conveyed to patient by PCP - Left ventricle: The cavity size was normal. Wall thickness was increased in a pattern of mild LVH. Systolic function was normal. The estimated ejection fraction was in the range of 55% to 60%. Wall motion was normal; there were no regional wall motion abnormalities. Doppler parameters are consistent with abnormal left ventricular relaxation (grade 1 diastolic dysfunction). - Aortic valve: Moderate regurgitation. - Mitral valve: Mild regurgitation.  Carotid dopplers 03/2012: Atherosclerotic disease at the carotid bulbs, right side greater than left. Estimated degree of stenosis in the internal carotid arteries is less than 50% bilaterally.   Surgical History:  Past Surgical History  Procedure Laterality Date  . Tubal ligation      bilateral  . Retinal tear repair cryotherapy    . Laminectomy  03-2010    T12 and L1     Home Meds: Prior to Admission medications   Medication Sig Start Date End Date Taking? Authorizing Provider  albuterol (PROVENTIL HFA;VENTOLIN HFA) 108 (90 BASE) MCG/ACT inhaler Inhale 2 puffs into the lungs every 6 (six) hours as needed for wheezing or shortness of breath. 08/27/13 08/27/14 Yes Agapito Games, MD  aspirin 81 MG tablet Take 81 mg by mouth daily.     Yes Historical Provider, MD  buPROPion (WELLBUTRIN XL) 300 MG 24 hr tablet Take 1 tablet (300 mg total) by mouth daily. 06/12/13  Yes Jamse Mead,  MD  Glycerin-Polysorbate 80 (REFRESH DRY EYE THERAPY OP) Place 2 drops into both eyes daily as needed (dry eyes).   Yes Historical Provider, MD  ibuprofen (ADVIL,MOTRIN) 200 MG tablet Take 400 mg by mouth every 4 (four) hours as needed for moderate pain.    Yes Historical Provider, MD  lamoTRIgine (LAMICTAL) 100 MG tablet Take 1 tablet (100 mg total) by mouth daily. 06/12/13  Yes Jamse Mead, MD  LORazepam (ATIVAN) 0.5 MG tablet Take 0.5 mg by mouth daily as needed for anxiety.   Yes Historical Provider, MD  losartan (COZAAR) 25 MG tablet Take 25 mg by mouth daily.   Yes Historical Provider, MD  omeprazole (PRILOSEC) 20 MG capsule Take 20 mg by mouth daily.     Yes Historical Provider, MD  ranitidine (ZANTAC) 150 MG tablet Take 150 mg by mouth at bedtime.     Yes Historical Provider, MD  sertraline (ZOLOFT) 100 MG tablet Take 2 tablets (200 mg total) by mouth daily. 06/12/13  Yes Jamse Mead, MD  simvastatin (ZOCOR) 80 MG tablet Take 80 mg by mouth at bedtime.   Yes Historical Provider, MD  furosemide (LASIX) 20 MG tablet Take 1 tablet (20 mg total) by mouth daily. 09/17/13   Marinda Elk, MD    Inpatient Medications:  . ipratropium  0.5 mg Nebulization BID   And  . albuterol  2.5 mg Nebulization BID  . aspirin EC  81 mg Oral Daily  . atorvastatin  40 mg Oral q1800  .  buPROPion  300 mg Oral Daily  . enoxaparin (LOVENOX) injection  40 mg Subcutaneous Q24H  . famotidine  20 mg Oral Daily  . furosemide  40 mg Intravenous BID  . lamoTRIgine  100 mg Oral Daily  . losartan  25 mg Oral Daily  . sertraline  200 mg Oral Daily  . sodium chloride  3 mL Intravenous Q12H      Allergies:  Allergies  Allergen Reactions  . Fosamax [Alendronate Sodium] Other (See Comments)    Acid reflux  . Lisinopril Cough  . Erythromycin Rash    History   Social History  . Marital Status: Divorced    Spouse Name: N/A    Number of Children: N/A  . Years of Education: N/A    Occupational History  . Not on file.   Social History Main Topics  . Smoking status: Current Every Day Smoker -- 1.00 packs/day for 30 years    Types: Cigarettes  . Smokeless tobacco: Not on file  . Alcohol Use: Yes     Comment: socially - only at holidays  . Drug Use: No  . Sexual Activity: No     Comment: [employer] an[employer], retired, divorced, 2 children, no regular exercise, 2-4 caffeine drinks daily.   Other Topics Concern  . Not on file   Social History Narrative  . No narrative on file     Family History  Problem Relation Age of Onset  . Parkinsonism      family history  . Diabetes      family history  . Hypertension      family history  . Heart block      family history/cardiovascular disease  . Diabetes Mother   . Coronary artery disease Mother     CABG - early 42s - at least 6 heart attacks  . Dementia Father   . Coronary artery disease Sister     some kind of operation, pt unsure     Review of Systems: General: negative for chills, fever, night sweats. Denies weight change or clothes fitting differently. Cardiovascular: see above, otherwise negative Dermatological: negative for rash Respiratory: + occ wheezing Urologic: negative for hematuria Abdominal: negative for nausea, vomiting, diarrhea, bright red blood per rectum, melena, or hematemesis Neurologic: negative for visual changes, syncope. Occasional dizziness i.e. Every 9 months  All other systems reviewed and are otherwise negative except as noted above.  Labs:  Recent Labs  09/15/13 1852 09/15/13 2218 09/16/13 0540  TROPONINI <0.30 <0.30 <0.30   Lab Results  Component Value Date   WBC 9.5 09/15/2013   HGB 11.5* 09/15/2013   HCT 35.2* 09/15/2013   MCV 88.2 09/15/2013   PLT 227 09/15/2013    Recent Labs Lab 09/15/13 1210  09/17/13 0551  NA 142  < > 142  K 3.2*  < > 4.0  CL 103  < > 104  CO2 24  < > 32  BUN 17  < > 15  CREATININE 0.81  < > 0.90  CALCIUM 8.5  < > 8.9  PROT 6.4  --    --   BILITOT 0.4  --   --   ALKPHOS 127*  --   --   ALT 12  --   --   AST 20  --   --   GLUCOSE 101*  < > 102*  < > = values in this interval not displayed.  Radiology/Studies:  Dg Chest 2 View  09/15/2013   CLINICAL DATA:  Shortness  of Breath  EXAM: CHEST - 2 VIEW  COMPARISON:  Earlier films of the same day  FINDINGS: Stable cardiomegaly. Pulmonary vascular congestion with some increase in mild interstitial edema since previous study, septal lines seen now peripherally in both lung bases. Suspect tiny pleural effusions as before, with adjacent atelectasis/ infiltrate in the lung bases, right greater than left. Spondylitic changes at thoracolumbar junction. . Tortuous thoracic aorta.  IMPRESSION: Stable cardiomegaly and effusions with slight worsening of bilateral interstitial edema   Electronically Signed   By: Oley Balm M.D.   On: 09/15/2013 13:56   Dg Chest 2 View  09/15/2013   CLINICAL DATA:  Shortness of Breath  EXAM: CHEST - 2 VIEW  COMPARISON:  02/10/2010  FINDINGS: Moderate cardiomegaly has developed. New central pulmonary vascular congestion. New perihilar and bibasilar interstitial edema or infiltrates. Small bilateral pleural effusions with adjacent atelectasis/ consolidation in the lung bases, right greater than left. Tortuous thoracic aorta. Degenerative changes noted at the thoracolumbar junction.  IMPRESSION: 1. Cardiomegaly, pulmonary vascular congestion ,bibasilar edema/ infiltrates, and small effusions suggesting CHF.   Electronically Signed   By: Oley Balm M.D.   On: 09/15/2013 10:35   EKG: sinus tach probable LAA, LVH, borderline low voltage extremity leads, baseline wander inferior leads, subtle ST depression V6.  Physical Exam: Blood pressure 101/64, pulse 85, temperature 98.9 F (37.2 C), temperature source Oral, resp. rate 18, height 5\' 3"  (1.6 m), weight 112 lb (50.803 kg), SpO2 92.00%. General: Well developed, well nourished WF in no acute distress. Head:  Normocephalic, atraumatic, sclera non-icteric, no xanthomas, nares are without discharge.  Neck: Negative for carotid bruits. JVD not elevated. Lungs: Bilateral crackles at bases. No wheezes or rhonchi. Breathing is unlabored. Heart: RRR with S1 S2. Soft murmur at apex. No rubs or gallops appreciated. Abdomen: Soft, non-tender, non-distended with normoactive bowel sounds. No hepatomegaly. No rebound/guarding. No obvious abdominal masses. Msk:  Strength and tone appear normal for age. Extremities: No clubbing or cyanosis. No edema.  Distal pedal pulses are 2+ and equal bilaterally. Neuro: Alert and oriented X 3. No facial asymmetry. No focal deficit. Moves all extremities spontaneously. Psych:  Responds to questions appropriately with a normal affect.   Assessment and Plan:   1. Acute combined systolic and diastolic CHF EF 81-19% associated with hypoxia 2. Moderate-severe MR 3. Moderate AI 4. HTN 5. COPD with ongoing tobacco abuse 6. Hyperlipidemia 7. Depression  LVEF is also now down with +WMA. She reports a history of exertional chest pressure accompanying the SOB. She has ruled out. With cardiac risk factors of ongoing tobacco abuse, HTN, HL, family history, would recomm a cardiac cath R/L) to evaluate for CAD and evaluate pulmonary pressues, assess for V wave. .  Continue ASA, Cozaar, IV Lasix, statin. BP is slightly slow so will hold off on adding beta blocker while we are diuresing. Check lipid panel in AM. Tobacco cessation encouraged.  Signed, Ronie Spies PA-C 09/17/2013, 4:59 PM   Patient seen and examined  I have amended note to reflect my findings.  Patient with progressive dyspnea.  On presentation had evid of CHF  Diuresing well.  Echo with LV dysfunction and evid of signif MI and AI Would recomm R/L heart cath and TEE after.

## 2013-09-18 NOTE — Progress Notes (Signed)
SUBJECTIVE: NO chest pain or SOB.   BP 120/69  Pulse 81  Temp(Src) 98.3 F (36.8 C) (Oral)  Resp 20  Ht 5\' 3"  (1.6 m)  Wt 110 lb 12.8 oz (50.259 kg)  BMI 19.63 kg/m2  SpO2 94%  Intake/Output Summary (Last 24 hours) at 09/18/13 1108 Last data filed at 09/18/13 0931  Gross per 24 hour  Intake    600 ml  Output   1100 ml  Net   -500 ml    PHYSICAL EXAM General: Well developed, well nourished, in no acute distress. Alert and oriented x 3.  Psych:  Good affect, responds appropriately Neck: No JVD. No masses noted.  Lungs: Clear bilaterally with no wheezes or rhonci noted.  Heart: RRR with systolic murmur noted. Abdomen: Bowel sounds are present. Soft, non-tender.  Extremities: No lower extremity edema.   LABS: Basic Metabolic Panel:  Recent Labs  21/30/86 1210 09/15/13 1852  09/17/13 0551 09/18/13 0525  NA 142  --   < > 142 141  K 3.2*  --   < > 4.0 4.0  CL 103  --   < > 104 101  CO2 24  --   < > 32 29  GLUCOSE 101*  --   < > 102* 89  BUN 17  --   < > 15 19  CREATININE 0.81  --   < > 0.90 1.16*  CALCIUM 8.5  --   < > 8.9 9.0  MG  --  1.8  --   --   --   < > = values in this interval not displayed. CBC:  Recent Labs  09/15/13 1210 09/18/13 0525  WBC 9.5 8.1  NEUTROABS 8.7*  --   HGB 11.5* 11.9*  HCT 35.2* 38.4  MCV 88.2 92.8  PLT 227 253   Cardiac Enzymes:  Recent Labs  09/15/13 1852 09/15/13 2218 09/16/13 0540  TROPONINI <0.30 <0.30 <0.30   Fasting Lipid Panel:  Recent Labs  09/18/13 0525  CHOL 183  HDL 66  LDLCALC 87  TRIG 149  CHOLHDL 2.8    Current Meds: . ipratropium  0.5 mg Nebulization BID   And  . albuterol  2.5 mg Nebulization BID  . aspirin EC  81 mg Oral Daily  . atorvastatin  40 mg Oral q1800  . buPROPion  300 mg Oral Daily  . enoxaparin (LOVENOX) injection  40 mg Subcutaneous Q24H  . famotidine  20 mg Oral Daily  . furosemide  40 mg Intravenous Once  . lamoTRIgine  100 mg Oral Daily  . losartan  25 mg Oral  Daily  . sertraline  200 mg Oral Daily  . sodium chloride  3 mL Intravenous Q12H   Cardiac cath 09/18/13: Hemodynamics:  RA 4  RV 28/1  PA 30/11 mean 19  PCWP 8  LV 138/3  AO 135/78  Oxygen saturations:  PA 57  AO 93  Cardiac Output (Fick) 3.45 Cardiac Index (Fick) 2.28  Coronary angiography:  Coronary dominance: right  Left mainstem: Normal  Left anterior descending (LAD): Normal. D1 large and normal.  Left circumflex (LCx): AV groove normal. RI moderate sized and normal. Large MOM normal  Right coronary artery (RCA): Dominant but somewhat small. Long mid 40%. AM large and normal. PDA off the AM normal. PL x 2 small and normal  Left ventriculography: Left ventricular systolic function is 30% with global hypokinesis. MR probably severe.  Final Conclusions: Nonischemic cardiomyopathy with mitral regurgitation as above.  Mild nonobstructive coronary disease.    ASSESSMENT AND PLAN:  1. Acute systolic CHF: she has been diuresed effectively. PCWP is now 8. Will hold on IV Lasix today.    2. Non-ischemic cardiomyopathy: No obstructive CAD by cath 09/18/13. Her mitral regurgitation is now severe.   3. Severe mitral valve regurgitation: Will plan TEE tomorrow to further assess her mitral valve. NPO at midnight. Will call CT surgery to see her after TEE.   4. Moderate Aortic valve insufficiency:   5. COPD: continue home therapy.   6.  HTN: BP controlled.   7. Hyperlipidemia: Continue statin.    MCALHANY,CHRISTOPHER  12/2/201411:08 AM

## 2013-09-18 NOTE — Progress Notes (Signed)
[facility] PROGRESS NOTE  Assessment/Plan: Acute diastolic heart failure due to severe MR: - Moderate  urine output. No JVD, mild increase in Cr. - ECHO severe MR markedly reduce EF. TEE pending and Right heart and left heart cath 12.2.2014 Left ventricular systolic function is 30% with global hypokinesis, Mild nonobstructive coronary disease . MR probably severe - monitor and replete electrolytes. - cardiac markers negative x 3 - cont ACE.  Hypokalemia: - replete K and Mag.  Hypertension - stable, cont lasix and ARB.  MDD (major depressive disorder) - stable cont home meds.    Code Status: full Family Communication: noen  Disposition Plan: inpatient   Consultants:  none  Procedures:  ECHO 11.30.2014  CXR 11.29.2014    Antibiotics:  None  HPI/Subjective: No complains  Objective: Filed Vitals:   09/17/13 2149 09/18/13 0458 09/18/13 0752 09/18/13 0930  BP: 99/58 102/51  120/69  Pulse: 88 74 87 81  Temp: 98.5 F (36.9 C) 98.8 F (37.1 C)  98.3 F (36.8 C)  TempSrc: Oral Oral  Oral  Resp: 16 17  20   Height:      Weight:  50.259 kg (110 lb 12.8 oz)    SpO2: 92% 93%  94%    Intake/Output Summary (Last 24 hours) at 09/18/13 1123 Last data filed at 09/18/13 0931  Gross per 24 hour  Intake    600 ml  Output   1100 ml  Net   -500 ml   Filed Weights   09/16/13 0532 09/17/13 0457 09/18/13 0458  Weight: 53.524 kg (118 lb) 50.803 kg (112 lb) 50.259 kg (110 lb 12.8 oz)    Exam:  General: Alert, awake, oriented x3, in no acute distress.  HEENT: No bruits, no goiter. Negative JVD. Heart: Regular rate and rhythm, +S3 Lungs: Good air movement, crackles at bases Abdomen: Soft, nontender, nondistended, positive bowel sounds.  Neuro: Grossly intact, nonfocal.   Data Reviewed: Basic Metabolic Panel:  Recent Labs Lab 09/15/13 1210 09/15/13 1852 09/16/13 0540 09/17/13 0551 09/18/13 0525  NA 142  --  138 142 141  K 3.2*  --  3.4* 4.0 4.0   CL 103  --  100 104 101  CO2 24  --  26 32 29  GLUCOSE 101*  --  104* 102* 89  BUN 17  --  18 15 19   CREATININE 0.81  --  1.10 0.90 1.16*  CALCIUM 8.5  --  8.5 8.9 9.0  MG  --  1.8  --   --   --    Liver Function Tests:  Recent Labs Lab 09/15/13 1210  AST 20  ALT 12  ALKPHOS 127*  BILITOT 0.4  PROT 6.4  ALBUMIN 3.1*   No results found for this basename: LIPASE, AMYLASE,  in the last 168 hours No results found for this basename: AMMONIA,  in the last 168 hours CBC:  Recent Labs Lab 09/15/13 1210 09/18/13 0525  WBC 9.5 8.1  NEUTROABS 8.7*  --   HGB 11.5* 11.9*  HCT 35.2* 38.4  MCV 88.2 92.8  PLT 227 253   Cardiac Enzymes:  Recent Labs Lab 09/15/13 1852 09/15/13 2218 09/16/13 0540  TROPONINI <0.30 <0.30 <0.30   BNP (last 3 results)  Recent Labs  09/15/13 1210  PROBNP 12210.0*   CBG: No results found for this basename: GLUCAP,  in the last 168 hours  No results found for this or any previous visit (from the past 240 hour(s)).   Studies: No results found.  Scheduled Meds: . ipratropium  0.5 mg Nebulization BID   And  . albuterol  2.5 mg Nebulization BID  . aspirin EC  81 mg Oral Daily  . atorvastatin  40 mg Oral q1800  . buPROPion  300 mg Oral Daily  . enoxaparin (LOVENOX) injection  40 mg Subcutaneous Q24H  . famotidine  20 mg Oral Daily  . furosemide  40 mg Intravenous Once  . lamoTRIgine  100 mg Oral Daily  . losartan  25 mg Oral Daily  . sertraline  200 mg Oral Daily  . sodium chloride  3 mL Intravenous Q12H   Continuous Infusions: . sodium chloride       Marinda Elk  [facility] Pager [phone].  If 8PM-8AM, please contact night-coverage at [email or web address], password [facility] 09/18/2013, 11:23 AM  LOS: 3 days

## 2013-09-18 NOTE — Plan of Care (Signed)
Problem: Phase I Progression Outcomes Goal: EF % per last Echo/documented,Core Reminder form on chart EF=40-45%

## 2013-09-18 NOTE — Progress Notes (Signed)
Report given to receiving RN. Patient is stable. No verbal complaints. No signs or symptoms of distress or discomfort. 

## 2013-09-18 NOTE — Interval H&P Note (Signed)
History and Physical Interval Note:  09/18/2013 8:11 AM  Alicia Aguilar  has presented today for surgery, with the diagnosis of Chest pain  The various methods of treatment have been discussed with the patient and family. After consideration of risks, benefits and other options for treatment, the patient has consented to  Procedure(s): LEFT AND RIGHT HEART CATHETERIZATION WITH CORONARY ANGIOGRAM (N/A) as a surgical intervention .  The patient's history has been reviewed, patient examined, no change in status, stable for surgery.  I have reviewed the patient's chart and labs.  Questions were answered to the patient's satisfaction.     [facility]  Cath Lab Visit (complete for each Cath Lab visit)  Clinical Evaluation Leading to the Procedure:   ACS: no  Non-ACS:    Anginal Classification: No Symptoms  Anti-ischemic medical therapy: No Therapy  Non-Invasive Test Results: No non-invasive testing performed  Prior CABG: No previous CABG

## 2013-09-18 NOTE — Progress Notes (Signed)
Spoke to the patient's daughter Nanetta Batty. She had been waiting for a few hours this afternoon to learn of the plan for the patient. She lives in [place], is a NP student at [employer] and would like to be kept in the loop regarding her mother's care. We discussed the patient's management up to this point, TTE and cath findings. Plan is for TEE tomorrow. Have placed NPO at MN order. Daughter believes this has been scheduled for 9AM tomorrow. Will need to make sure this has been coordinated, and that TCTS has been consulted after TEE. She will need to be kept informed regarding the plan from here on [phone] She would like to be present for major procedures in the future and will help to coordinate care after this hospitalization. Answered all questions. She was appreciative.   Jacqulyn Bath, PA-C 09/18/2013 6:39 PM

## 2013-09-19 ENCOUNTER — Encounter (HOSPITAL_COMMUNITY)
Admission: EM | Disposition: A | Discharge status: Skilled Nursing Facility | Payer: Self-pay | Source: Home / Self Care | Attending: Internal Medicine

## 2013-09-19 ENCOUNTER — Encounter (HOSPITAL_COMMUNITY): Payer: Self-pay | Admitting: *Deleted

## 2013-09-19 DIAGNOSIS — F329 Major depressive disorder, single episode, unspecified: Secondary | ICD-10-CM

## 2013-09-19 DIAGNOSIS — E785 Hyperlipidemia, unspecified: Secondary | ICD-10-CM

## 2013-09-19 DIAGNOSIS — I059 Rheumatic mitral valve disease, unspecified: Secondary | ICD-10-CM

## 2013-09-19 HISTORY — PX: TEE WITHOUT CARDIOVERSION: SHX5443

## 2013-09-19 SURGERY — ECHOCARDIOGRAM, TRANSESOPHAGEAL
Anesthesia: Moderate Sedation

## 2013-09-19 MED ORDER — MIDAZOLAM HCL 10 MG/2ML IJ SOLN
INTRAMUSCULAR | Status: DC | PRN
  Administered 2013-09-19: 2 mg via INTRAVENOUS
  Administered 2013-09-19: 1 mg via INTRAVENOUS

## 2013-09-19 MED ORDER — FENTANYL CITRATE 0.05 MG/ML IJ SOLN
INTRAMUSCULAR | Status: DC | PRN
  Administered 2013-09-19: 25 ug via INTRAVENOUS

## 2013-09-19 MED ORDER — SODIUM CHLORIDE 0.9 % IV SOLN: INTRAVENOUS | Status: DC

## 2013-09-19 MED ORDER — BUTAMBEN-TETRACAINE-BENZOCAINE 2-2-14 % EX AERO
INHALATION_SPRAY | CUTANEOUS | Status: DC | PRN
  Administered 2013-09-19: 2 via TOPICAL

## 2013-09-19 MED ORDER — MIDAZOLAM HCL 5 MG/ML IJ SOLN
INTRAMUSCULAR | Status: AC
  Filled 2013-09-19: qty 2

## 2013-09-19 MED ORDER — SODIUM CHLORIDE 0.9 % IV SOLN
INTRAVENOUS | Status: DC
  Administered 2013-09-19: 09:00:00 500 mL via INTRAVENOUS

## 2013-09-19 MED ORDER — FENTANYL CITRATE 0.05 MG/ML IJ SOLN
INTRAMUSCULAR | Status: AC
  Filled 2013-09-19: qty 2

## 2013-09-19 NOTE — CV Procedure (Signed)
TEE 4mg  versed 25 ug fentanyl  Moderate LAE EF 35-40% diffuse hypokinesis worse in inferior wallo Normal RA/RV Moderate AR More moderate MR see full report in [facility] for [facility].  Minidmal flow reversal in pulmonary veins  Annulus not dilated No LAA thrombus Mild TR No ASD Moderate mural aortic debris  I think her LV dysfunction is the major issue. Do not think MVR would help and may make EF worse.  She has responed well to medical Rx so far Would continue  [employer]

## 2013-09-19 NOTE — Progress Notes (Signed)
Pt. C/o of intermittent, aching 7/10 pain under left breast that radiates to back. Pt. States it feels like aching muscle pain. PRN Tylenol administered as ordered. On call NP, K. Schorr, made aware via text page. RN will continue to monitor pt. For changes in condition. Ulric Salzman, Cheryll Dessert

## 2013-09-19 NOTE — Progress Notes (Signed)
[facility] PROGRESS NOTE  Assessment/Plan: Acute diastolic heart failure due to severe MR: - Moderate  urine output. No JVD, slight increase in Cr. -ECHO severe MR, markedly reduce EF. TEE corroborate Echo results; but per cardiology no needs of valvular replacement at this point -Right heart and left heart cath 12.2.2014 (Left ventricular systolic function is 30-40% with global hypokinesis). -Moderate MR, no need for replacement at this point -monitor and replete electrolytes as needed. -cardiac markers negative x 3 -plan is to continue medical management  -cont ACE. -per cardiology low dose B-blocker if BP stable. -will follow rec's  Hypokalemia: - repleted -will follow BMET in am  Hypertension - stable overall, cont lasix and ARB.  MDD (major depressive disorder) - stable cont home meds.  COPD/Asthma: -continue PRN combivent nebulizer  HLD: -continue statins  Physical deconditioning: -SNF at discharge    Code Status: full Family Communication: no family at bedside Disposition Plan: SNF at discharge   Consultants:  none  Procedures:  ECHO 11.30.2014  CXR 11.29.2014  Left heart Cath 12.2.14  TEE 12.3.14  Antibiotics:  None  HPI/Subjective: Reports some pain on her legs; according to patient due to sciatica. Afebrile. No CP and reports significant improvement in her breathing  Objective: Filed Vitals:   09/19/13 1000 09/19/13 1010 09/19/13 1330 09/19/13 1453  BP: 127/55 133/76 104/65 130/87  Pulse: 85 86 85 81  Temp:   98.4 F (36.9 C)   TempSrc:   Oral   Resp: 22 16 20    Height:      Weight:      SpO2: 93% 95% 96%     Intake/Output Summary (Last 24 hours) at 09/19/13 1927 Last data filed at 09/19/13 1800  Gross per 24 hour  Intake    900 ml  Output    900 ml  Net      0 ml   Filed Weights   09/17/13 0457 09/18/13 0458 09/19/13 0500  Weight: 50.803 kg (112 lb) 50.259 kg (110 lb 12.8 oz) 50.213 kg (110 lb 11.2 oz)     Exam:  General: Alert, awake, oriented x3, in no acute distress.  HEENT: No bruits, no goiter. Negative JVD. Heart: Regular rate and rhythm, +S3, positive SEM Lungs: Good air movement, fine crackles at bases Abdomen: Soft, nontender, nondistended, positive bowel sounds.  Neuro: Grossly intact, nonfocal.  Data Reviewed: Basic Metabolic Panel:  Recent Labs Lab 09/15/13 1210 09/15/13 1852 09/16/13 0540 09/17/13 0551 09/18/13 0525  NA 142  --  138 142 141  K 3.2*  --  3.4* 4.0 4.0  CL 103  --  100 104 101  CO2 24  --  26 32 29  GLUCOSE 101*  --  104* 102* 89  BUN 17  --  18 15 19   CREATININE 0.81  --  1.10 0.90 1.16*  CALCIUM 8.5  --  8.5 8.9 9.0  MG  --  1.8  --   --   --    Liver Function Tests:  Recent Labs Lab 09/15/13 1210  AST 20  ALT 12  ALKPHOS 127*  BILITOT 0.4  PROT 6.4  ALBUMIN 3.1*   CBC:  Recent Labs Lab 09/15/13 1210 09/18/13 0525  WBC 9.5 8.1  NEUTROABS 8.7*  --   HGB 11.5* 11.9*  HCT 35.2* 38.4  MCV 88.2 92.8  PLT 227 253   Cardiac Enzymes:  Recent Labs Lab 09/15/13 1852 09/15/13 2218 09/16/13 0540  TROPONINI <0.30 <0.30 <0.30   BNP (last 3  results)  Recent Labs  09/15/13 1210  PROBNP 12210.0*    Studies: No results found.  Scheduled Meds: . ipratropium  0.5 mg Nebulization BID   And  . albuterol  2.5 mg Nebulization BID  . aspirin EC  81 mg Oral Daily  . atorvastatin  40 mg Oral q1800  . buPROPion  300 mg Oral Daily  . enoxaparin (LOVENOX) injection  40 mg Subcutaneous Q24H  . famotidine  20 mg Oral Daily  . lamoTRIgine  100 mg Oral Daily  . losartan  25 mg Oral Daily  . sertraline  200 mg Oral Daily  . sodium chloride  3 mL Intravenous Q12H   Continuous Infusions:   Time: < 30 minutes  Breeanne Oblinger  [facility] Pager [phone].  If 8PM-8AM, please contact night-coverage at [email or web address], password [facility] 09/19/2013, 7:27 PM  LOS: 4 days

## 2013-09-19 NOTE — Progress Notes (Signed)
Pt. Post cardiac cath today. Right groin assessed with off going RN at shift change. Right groin level 0. Pt. Denies pain or discomfort. RN will continue to monitor pt. For changes in condition. Marilyn Wing, Cheryll Dessert

## 2013-09-19 NOTE — Progress Notes (Signed)
Physical Therapy Treatment Patient Details Name: Alicia Aguilar MRN: [id number] DOB: 1946/10/02 Today's Date: 09/19/2013 Time: 4098-1191 PT Time Calculation (min): 33 min  PT Assessment / Plan / Recommendation  History of Present Illness Patient presents with concern of dyspnea.  Mitral valve regurgitation, acute chf, aortic valve insufficiency, and cardiac insufficiency present.   PT Comments   Patient as balance deficits that are self reported and obvious upon exam.  These balance deficits present a major fall risk if she were to go home, especially since she reports a history of past falls.  Her foot drop also presents a limitation and among with the other limitations listed in this note, would benefit from further rehabilitation at a SNF.  Pt will also benefit from skilled PT intervention in the hospital to address these limitations as she d/c to SNF.  Follow Up Recommendations  SNF        Barriers to Discharge Decreased caregiver support      Equipment Recommendations  None recommended by PT    Recommendations for Other Services Other (comment)  Frequency Min 2X/week         Precautions / Restrictions Precautions Precautions: Fall Restrictions Weight Bearing Restrictions: No   Pertinent Vitals/Pain SPO2 always above 98% throughout ambulation on RA    Mobility  Bed Mobility Bed Mobility: Supine to Sit Supine to Sit: 4: Min guard Details for Bed Mobility Assistance: for safety Transfers Transfers: Sit to Stand;Stand to Sit Sit to Stand: 4: Min guard Stand to Sit: 4: Min guard Details for Transfer Assistance: For safety Ambulation/Gait Ambulation/Gait Assistance: 4: Min assist Ambulation Distance (Feet): 300 Feet (rest break x1 to catch her breath) Ambulation/Gait Assistance Details: Requires min A due to poor balance.  She stumbles frequency and reports this at home to.  The stumbing and poor balance occur frequently and with head turns. Gait Pattern: Step-through  pattern;Left hip hike (To compensate for left foot drop ) General Gait Details: Foot drop is fairly well compensated but still presents a functional limitation in gait because it occasionally drags especially with fatigue Stairs: Yes Stairs Assistance: 4: Min assist Stairs Assistance Details (indicate cue type and reason): Clips her left foot on the lip of the stairs as she ascends due to foot drop Stair Management Technique: Step to pattern Number of Stairs: 10    Exercises     PT Diagnosis: Difficulty walking;Generalized weakness  PT Problem List: Decreased strength;Decreased balance;Decreased mobility;Decreased coordination PT Treatment Interventions: Gait training;Stair training;Therapeutic activities;Functional mobility training;Balance training;Neuromuscular re-education   PT Goals (current goals can now be found in the care plan section) Acute Rehab PT Goals Patient Stated Goal: rehab at snf PT Goal Formulation: With patient Time For Goal Achievement: 10/03/13 Potential to Achieve Goals: Good Additional Goals Additional Goal #1: Pt will understand and be compliant with strategies to minimize fall risk  Visit Information  Last PT Received On: 09/19/13 History of Present Illness: Patient presents with concern of dyspnea.  Mitral valve regurgitation, acute chf, aortic valve insufficiency, and cardiac insufficiency present.    Subjective Data  Patient Stated Goal: rehab at snf   Cognition  Cognition Behavior During Therapy: [facility] for tasks assessed/performed Overall Cognitive Status: Within Functional Limits for tasks assessed       End of Session PT - End of Session Equipment Utilized During Treatment: Gait belt Activity Tolerance: Patient tolerated treatment well Patient left: in chair;with call bell/phone within reach Nurse Communication: Mobility status   GP    Barrie Dunker,  SPT Pager:  [phone]  Barrie Dunker 09/19/2013, 5:59 PM

## 2013-09-19 NOTE — Interval H&P Note (Signed)
History and Physical Interval Note:  09/19/2013 8:40 AM  Alicia Aguilar  has presented today for surgery, with the diagnosis of severe MR   The various methods of treatment have been discussed with the patient and family. After consideration of risks, benefits and other options for treatment, the patient has consented to  Procedure(s): TRANSESOPHAGEAL ECHOCARDIOGRAM (TEE) (N/A) as a surgical intervention .  The patient's history has been reviewed, patient examined, no change in status, stable for surgery.  I have reviewed the patient's chart and labs.  Questions were answered to the patient's satisfaction.     Charlton Haws

## 2013-09-19 NOTE — Progress Notes (Signed)
     SUBJECTIVE: No chest pain or SOB. No events.   Tele: sinus  BP 104/55  Pulse 88  Temp(Src) 98 F (36.7 C) (Oral)  Resp 16  Ht 5\' 3"  (1.6 m)  Wt 110 lb 11.2 oz (50.213 kg)  BMI 19.61 kg/m2  SpO2 97%  Intake/Output Summary (Last 24 hours) at 09/19/13 2956 Last data filed at 09/19/13 0700  Gross per 24 hour  Intake    720 ml  Output    250 ml  Net    470 ml    PHYSICAL EXAM General: Well developed, well nourished, in no acute distress. Alert and oriented x 3.  Psych:  Good affect, responds appropriately Neck: No JVD. No masses noted.  Lungs: Clear bilaterally with no wheezes or rhonci noted.  Heart: RRR with systolic murmur noted. Abdomen: Bowel sounds are present. Soft, non-tender.  Extremities: No lower extremity edema.   LABS: Basic Metabolic Panel:  Recent Labs  21/30/86 0551 09/18/13 0525  NA 142 141  K 4.0 4.0  CL 104 101  CO2 32 29  GLUCOSE 102* 89  BUN 15 19  CREATININE 0.90 1.16*  CALCIUM 8.9 9.0   CBC:  Recent Labs  09/18/13 0525  WBC 8.1  HGB 11.9*  HCT 38.4  MCV 92.8  PLT 253   Fasting Lipid Panel:  Recent Labs  09/18/13 0525  CHOL 183  HDL 66  LDLCALC 87  TRIG 149  CHOLHDL 2.8    Current Meds: . ipratropium  0.5 mg Nebulization BID   And  . albuterol  2.5 mg Nebulization BID  . aspirin EC  81 mg Oral Daily  . atorvastatin  40 mg Oral q1800  . buPROPion  300 mg Oral Daily  . enoxaparin (LOVENOX) injection  40 mg Subcutaneous Q24H  . famotidine  20 mg Oral Daily  . furosemide  40 mg Intravenous Once  . lamoTRIgine  100 mg Oral Daily  . losartan  25 mg Oral Daily  . sertraline  200 mg Oral Daily  . sodium chloride  3 mL Intravenous Q12H     ASSESSMENT AND PLAN:  1. Acute systolic CHF: she has been diuresed effectively, down 9 lbs since admission. PCWP is 8 by cath 09/18/13. No Lasix today.   2. Non-ischemic cardiomyopathy: No obstructive CAD by cath 09/18/13. Her mitral regurgitation is now severe. She is on an  ARB. Will start Coreg if BP will tolerate.   3. Severe mitral valve regurgitation: Will plan TEE today to further assess her mitral valve. Will call CT surgery to see her after TEE today. She will likely need MV repair/replacement.  4. Moderate Aortic valve insufficiency: Further assessment by TEE today.   5. COPD: continue home therapy.   6. HTN: BP controlled.   7. Hyperlipidemia: Continue statin.     Alicia Aguilar  12/3/20147:21 AM

## 2013-09-19 NOTE — Progress Notes (Addendum)
Pt. Taken for a Cardiac Cath around 8:00 am. Pt. Returned to Unit stable and no signs of distress or discomfort except for minor pain she attributed to her Chronic sciatica issues. I gave pt. Tylenol for the pain rated at a 6 that decreased to a 3. Pt.'s cath site stable with no signs of infection and remained at a level 0 throughout my shift checks.

## 2013-09-19 NOTE — Progress Notes (Signed)
  Echocardiogram Echocardiogram Transesophageal has been performed.  [name] 09/19/2013, 1:43 PM

## 2013-09-20 ENCOUNTER — Encounter (HOSPITAL_COMMUNITY): Payer: Self-pay | Admitting: Cardiovascular Disease

## 2013-09-20 DIAGNOSIS — K219 Gastro-esophageal reflux disease without esophagitis: Secondary | ICD-10-CM

## 2013-09-20 LAB — BASIC METABOLIC PANEL
Calcium: 8.8 mg/dL (ref 8.4–10.5)
Chloride: 100 mEq/L (ref 96–112)
Creatinine, Ser: 1.07 mg/dL (ref 0.50–1.10)
GFR calc Af Amer: 61 mL/min — ABNORMAL LOW (ref >90)
GFR calc non Af Amer: 52 mL/min — ABNORMAL LOW (ref >90)
Glucose, Bld: 88 mg/dL (ref 70–99)
Potassium: 3.5 mEq/L (ref 3.5–5.1)
Sodium: 141 mEq/L (ref 135–145)

## 2013-09-20 LAB — CBC
HCT: 40.2 % (ref 36.0–46.0)
Hemoglobin: 12.6 g/dL (ref 12.0–15.0)
MCHC: 31.3 g/dL (ref 30.0–36.0)
RBC: 4.41 MIL/uL (ref 3.87–5.11)
WBC: 7.5 10*3/uL (ref 4.0–10.5)

## 2013-09-20 MED ORDER — POTASSIUM CHLORIDE CRYS ER 20 MEQ PO TBCR
20.0000 meq | EXTENDED_RELEASE_TABLET | Freq: Every day | ORAL | Status: DC
  Administered 2013-09-20 – 2013-09-21 (×2): 20 meq via ORAL
  Filled 2013-09-20 (×2): qty 1

## 2013-09-20 MED ORDER — FUROSEMIDE 20 MG PO TABS
20.0000 mg | ORAL_TABLET | Freq: Every day | ORAL | Status: DC
  Administered 2013-09-20 – 2013-09-21 (×2): 20 mg via ORAL
  Filled 2013-09-20 (×2): qty 1

## 2013-09-20 MED ORDER — FUROSEMIDE 20 MG PO TABS: 20.0000 mg | ORAL_TABLET | Freq: Every day | ORAL | Status: AC

## 2013-09-20 MED ORDER — POTASSIUM CHLORIDE CRYS ER 20 MEQ PO TBCR: 20.0000 meq | EXTENDED_RELEASE_TABLET | Freq: Every day | ORAL | Status: AC

## 2013-09-20 MED ORDER — OXYCODONE HCL 5 MG PO TABS: 5.0000 mg | ORAL_TABLET | Freq: Four times a day (QID) | ORAL | Status: AC | PRN

## 2013-09-20 MED ORDER — LORAZEPAM 0.5 MG PO TABS: 0.5000 mg | ORAL_TABLET | Freq: Every day | ORAL | Status: AC | PRN

## 2013-09-20 MED ORDER — CARVEDILOL 3.125 MG PO TABS: 3.1250 mg | ORAL_TABLET | Freq: Two times a day (BID) | ORAL | Status: AC

## 2013-09-20 NOTE — Progress Notes (Signed)
Pt's daughter request to get update r/t pt's meds : lasix.   Amanda Pea, Charity fundraiser.

## 2013-09-20 NOTE — Progress Notes (Signed)
Dr. Gwenlyn Perking notified pt's daughter wanted  To know if she will still taking home lasix.  MD instructed that she will be taking her home dose and also will start her on some kcl.  Pt and her daughter informed and thankful.  Sully Dyment,RN.

## 2013-09-20 NOTE — Progress Notes (Signed)
Patient ID: Alicia Aguilar, female   DOB: [birth year], 67 y.o.   MRN: [id number]     SUBJECTIVE: No chest pain or SOB. No events.   Tele: sinus rhythm no VT  09/20/2013   BP 137/78  Pulse 80  Temp(Src) 98.1 F (36.7 C) (Oral)  Resp 19  Ht 5\' 3"  (1.6 m)  Wt 108 lb 12.8 oz (49.351 kg)  BMI 19.28 kg/m2  SpO2 99%  Intake/Output Summary (Last 24 hours) at 09/20/13 4098 Last data filed at 09/19/13 2200  Gross per 24 hour  Intake    900 ml  Output   1200 ml  Net   -300 ml    PHYSICAL EXAM General: Well developed, well nourished, in no acute distress. Alert and oriented x 3.  Psych:  Good affect, responds appropriately Neck: No JVD. No masses noted.  Lungs: Clear bilaterally with no wheezes or rhonci noted.  Heart: RRR with systolic murmur noted. Abdomen: Bowel sounds are present. Soft, non-tender.  Extremities: No lower extremity edema.   LABS: Basic Metabolic Panel:  Recent Labs  11/91/47 0525 09/20/13 0542  NA 141 141  K 4.0 3.5  CL 101 100  CO2 29 30  GLUCOSE 89 88  BUN 19 19  CREATININE 1.16* 1.07  CALCIUM 9.0 8.8   CBC:  Recent Labs  09/18/13 0525 09/20/13 0542  WBC 8.1 7.5  HGB 11.9* 12.6  HCT 38.4 40.2  MCV 92.8 91.2  PLT 253 328   Fasting Lipid Panel:  Recent Labs  09/18/13 0525  CHOL 183  HDL 66  LDLCALC 87  TRIG 149  CHOLHDL 2.8    Current Meds: . ipratropium  0.5 mg Nebulization BID   And  . albuterol  2.5 mg Nebulization BID  . aspirin EC  81 mg Oral Daily  . atorvastatin  40 mg Oral q1800  . buPROPion  300 mg Oral Daily  . enoxaparin (LOVENOX) injection  40 mg Subcutaneous Q24H  . famotidine  20 mg Oral Daily  . lamoTRIgine  100 mg Oral Daily  . losartan  25 mg Oral Daily  . sertraline  200 mg Oral Daily  . sodium chloride  3 mL Intravenous Q12H     ASSESSMENT AND PLAN:  1. Acute systolic CHF: she has been diuresed effectively, down 9 lbs since admission. PCWP is 8 by cath 09/18/13. Lasix 20 and Kdur 20 home doses to  start   2. Non-ischemic cardiomyopathy: No obstructive CAD by cath 09/18/13.  She is on an ARB.   3. Moderate : TEE with more moderate MR and with decreased EF risky to consider bivalve replacement as EF likely to get worse Patient also prefers trial of medical RX  4. Moderate Aortic valve insufficiency: on afterload reduction   5. COPD: continue home therapy.   6. HTN: BP controlled.   7. Hyperlipidemia: Continue statin.   Probably home in am with outpatient f/u Dr Lamont Snowball [facility]  12/4/20149:17 AM

## 2013-09-20 NOTE — Progress Notes (Addendum)
Bed offers in place; family has accepted a bed at [facility].  This is about 3 1/2 hours from [place] and her daughter Dondra Spry is going to transport her via car. She hopes that they can be d/c'd by 10:00 due to the long length of the journey. CSW discussed with Janece Canterbury- Admissions at [facility] who is aware and agreeable to same.  Dr. Gwenlyn Perking will complete d/c summary as soon as possible.  CSW will facilitate d/c in the a.m.  CSW discussed with patients nurse.  AVS and d/c summary  has been faxed to facility this evening.  Lorri Frederick. [place]  [phone]

## 2013-09-20 NOTE — Clinical Social Work Psychosocial (Addendum)
    Clinical Social Work Department BRIEF PSYCHOSOCIAL ASSESSMENT 09/20/2013  Patient:  Alicia Aguilar, Alicia Aguilar     Account Number:  [id number]     Admit date:  09/15/2013  Clinical Social Worker:  Tiburcio Pea  Date/Time:  09/18/2013 04:45 PM  Referred by:  Physician  Date Referred:  09/17/2013 Referred for  SNF Placement   Other Referral:   Interview type:  Other - See comment Other interview type:   Patient and daughter Alicia Aguilar    PSYCHOSOCIAL DATA Living Status:  ALONE Admitted from facility:   Level of care:   Primary support name:  Alicia Aguilar  ([id number] Primary support relationship to patient:  CHILD, ADULT Degree of support available:   Strong support  Daughter Alicia Spry*- lives several hours away    CURRENT CONCERNS Current Concerns  Post-Acute Placement   Other Concerns:    SOCIAL WORK ASSESSMENT / PLAN CSW met with patient and her daughter Alicia Aguilar to discuss discharge disposition. Patient currently lives alone and daughter states that she will not be safe to return there at this time.  Discussed various dispositions including short term SNF, ALF, private duty, and home health.  At this time- they opt for short term SNF with Medicare and then will look at next level of disposition. Bed search process discussed and will initate process. Fl2 placed on chart for MD's signature.  Awaiting PT evaluation.   Assessment/plan status:  Psychosocial Support/Ongoing Assessment of Needs Other assessment/ plan:   Information/referral to community resources:   SNF bed list provided to patient and daughter.  They are intersted in [place] [facility]. Daughter Alicia Spry may also be intersted in bed search in counties near her home.    PATIENT'S/FAMILY'S RESPONSE TO PLAN OF CARE: Patient is alert, oriented and very invovled in her d/c planning process  along with her daughter Alicia Aguilar. She was hoping to go home but is aware that she will not be safe at  this time to do so.  Nursing reported that patient was possibly a hoarder; daughter relates that patient has not been able to manage the household due to health issues but is not a horder.  Patient and daughter were pleased with dc plan and were appreciative of CSW's visit and assistance with bed search. Lorri Frederick. Breckin Zafar, LCSWA  [phone]

## 2013-09-20 NOTE — Discharge Summary (Addendum)
Physician Discharge Summary  Alicia Aguilar AVW:[id number] DOB: June 27, 1946 DOA: 09/15/2013  PCP: Nani Gasser, MD  Admit date: 09/15/2013 Discharge date: 09/21/2013  Time spent: >30 minutes  Recommendations for Outpatient Follow-up:  1. BMET in 1 week to follow electrolytes and renal function 2. Reassess BP and adjust medications as needed 3. Follow up with cardiology for further evaluation and treatment of her systolic/diastolic heart failure and valvular disease (severe MR)   BNP    Component Value Date/Time   PROBNP 732.9* 09/21/2013 0639   Filed Weights   09/18/13 0458 09/19/13 0500 09/20/13 0551  Weight: 50.259 kg (110 lb 12.8 oz) 50.213 kg (110 lb 11.2 oz) 49.351 kg (108 lb 12.8 oz)    Discharge Diagnoses:  Active Problems:   TOBACCO ABUSE   MDD (major depressive disorder)   HYPERLIPIDEMIA   Hypertension   COPD (chronic obstructive pulmonary disease)   Acute combined systolic and diastolic congestive heart failure   Hypokalemia   Mitral regurgitation   Aortic insufficiency   Severe mitral regurgitation   Discharge Condition: stable and improved.  Diet recommendation: low sodium heart healthy diet (less than 1.5-2 grams daily)  History of present illness:  67 y.o. female Who comes in c/o SOB that began approximately 2 weeks ago. Patient was previously on steroids, doxycycline and Initially had some improvement, though over the past days she has had increasing symptoms. Patient went to urgent care due to worsening symptoms and subsequent transfer to ED. While in ED she was found  to have edema in her left lower extremity, elevated BNP elevated and pulmonary congestion on chest x ray. [facility] hospital called to admit for further evaluation and treatment. Of note; she had hx of mild mitral and aortic regurgitation; adn diastolic grade 1 HF per last 2-d echo in June 2013  Hospital Course:  Acute diastolic heart failure due to severe MR:  -good urine output. No JVD,  Cr back to baseline -ECHO severe MR, markedly reduce EF. TEE corroborate Echo results; but per cardiology no needs of valvular replacement at this point  -Right heart and left heart cath 12.2.2014 (Left ventricular systolic function is 30-40% with global hypokinesis).  -Moderate MR, no need for replacement at this point  -monitor and replete electrolytes as needed.  -cardiac markers negative x 3  -plan is to continue medical management with ARB and per cardiology rec's low dose B-blocker and follow BP  -cardiology will follow patient after discharge for further medication adjustments -patient educated regarding low sodium diet and needs for daily weight.  Hypokalemia:  -repleted  -will follow BMET in 1 week -started on daily maintenance   Hypertension  - stable overall, cont lasix and ARB. -outpatient follow up for further adjustments    MDD (major depressive disorder)  - stable cont home meds.   Hx of COPD/Asthma:  -continue PRN albuterol  -no SOB, no wheezing   HLD:  -continue statins   Physical deconditioning:  -SNF at discharge  *Rest of medical problems remains stable and the plan is to continue current medication regimen and patient will follow with PCP for any adjustment as needed in the future.  Procedures: 2-D echo 09/16/13 (severe MR, markedly reduce EF 35-40%) TEE 09/19/13 corroborate Echo results Right heart and left heart cath 12.2.2014 (Left ventricular systolic function is 30-40% with global hypokinesis).  Consultations:  Cardiology   Discharge Exam: Filed Vitals:   09/20/13 1444  BP: 108/86  Pulse: 78  Temp: 98 F (36.7 C)  Resp: 16  General: Alert, awake, oriented x3, in no acute distress.  HEENT: No bruits, no goiter. Negative JVD.  Heart: Regular rate and rhythm, +S3, positive SEM; no rubs  Lungs: Good air movement, fine crackles at bases  Abdomen: Soft, nontender, nondistended, positive bowel sounds.  Neuro: Grossly intact,  nonfocal.   Discharge Instructions  Discharge Orders   Future Appointments Provider Department Dept Phone   09/27/2013 1:30 PM Agapito Games, MD [facility] [place] [phone]   Future Orders Complete By Expires   Diet - low sodium heart healthy  As directed    Discharge instructions  As directed    Comments:     Follow a low sodium diet (less than 1.5-2 Grams daily) Take medications as prescribed Follow up with cardiology service in 1 week   Increase activity slowly  As directed        Medication List    STOP taking these medications       AMBULATORY NON FORMULARY MEDICATION     doxycycline 100 MG tablet  Commonly known as:  VIBRA-TABS      TAKE these medications       albuterol 108 (90 BASE) MCG/ACT inhaler  Commonly known as:  PROVENTIL HFA;VENTOLIN HFA  Inhale 2 puffs into the lungs every 6 (six) hours as needed for wheezing or shortness of breath.     aspirin 81 MG tablet  Take 81 mg by mouth daily.     buPROPion 300 MG 24 hr tablet  Commonly known as:  WELLBUTRIN XL  Take 1 tablet (300 mg total) by mouth daily.     carvedilol 3.125 MG tablet  Commonly known as:  COREG  Take 1 tablet (3.125 mg total) by mouth 2 (two) times daily with a meal.     furosemide 20 MG tablet  Commonly known as:  LASIX  Take 1 tablet (20 mg total) by mouth daily.     ibuprofen 200 MG tablet  Commonly known as:  ADVIL,MOTRIN  Take 400 mg by mouth every 4 (four) hours as needed for moderate pain.     lamoTRIgine 100 MG tablet  Commonly known as:  LAMICTAL  Take 1 tablet (100 mg total) by mouth daily.     LORazepam 0.5 MG tablet  Commonly known as:  ATIVAN  Take 1 tablet (0.5 mg total) by mouth daily as needed for anxiety.     losartan 25 MG tablet  Commonly known as:  COZAAR  Take 25 mg by mouth daily.     omeprazole 20 MG capsule  Commonly known as:  PRILOSEC  Take 20 mg by mouth daily.     oxyCODONE 5 MG immediate release tablet   Commonly known as:  Oxy IR/ROXICODONE  Take 1 tablet (5 mg total) by mouth every 6 (six) hours as needed for severe pain.     potassium chloride SA 20 MEQ tablet  Commonly known as:  K-DUR,KLOR-CON  Take 1 tablet (20 mEq total) by mouth daily.     ranitidine 150 MG tablet  Commonly known as:  ZANTAC  Take 150 mg by mouth at bedtime.     REFRESH DRY EYE THERAPY OP  Place 2 drops into both eyes daily as needed (dry eyes).     sertraline 100 MG tablet  Commonly known as:  ZOLOFT  Take 2 tablets (200 mg total) by mouth daily.     simvastatin 80 MG tablet  Commonly known as:  ZOCOR  Take 80 mg  by mouth at bedtime.       Allergies  Allergen Reactions  . Fosamax [Alendronate Sodium] Other (See Comments)    Acid reflux  . Lisinopril Cough  . Erythromycin Rash       Follow-up Information   Follow up with METHENEY,CATHERINE, MD On 09/27/2013. (Thursday, @ 1:30 PM; 2 weeks follow-up echo report)    Specialty:  Family Medicine   Contact information:   [phone] [address] [place] [address] [phone]       The results of significant diagnostics from this hospitalization (including imaging, microbiology, ancillary and laboratory) are listed below for reference.    Significant Diagnostic Studies: Dg Chest 2 View  09/15/2013   CLINICAL DATA:  Shortness of Breath  EXAM: CHEST - 2 VIEW  COMPARISON:  Earlier films of the same day  FINDINGS: Stable cardiomegaly. Pulmonary vascular congestion with some increase in mild interstitial edema since previous study, septal lines seen now peripherally in both lung bases. Suspect tiny pleural effusions as before, with adjacent atelectasis/ infiltrate in the lung bases, right greater than left. Spondylitic changes at thoracolumbar junction. . Tortuous thoracic aorta.  IMPRESSION: Stable cardiomegaly and effusions with slight worsening of bilateral interstitial edema   Electronically Signed   By: Oley Balm M.D.   On:  09/15/2013 13:56   Dg Chest 2 View  09/15/2013   CLINICAL DATA:  Shortness of Breath  EXAM: CHEST - 2 VIEW  COMPARISON:  02/10/2010  FINDINGS: Moderate cardiomegaly has developed. New central pulmonary vascular congestion. New perihilar and bibasilar interstitial edema or infiltrates. Small bilateral pleural effusions with adjacent atelectasis/ consolidation in the lung bases, right greater than left. Tortuous thoracic aorta. Degenerative changes noted at the thoracolumbar junction.  IMPRESSION: 1. Cardiomegaly, pulmonary vascular congestion ,bibasilar edema/ infiltrates, and small effusions suggesting CHF.   Electronically Signed   By: Oley Balm M.D.   On: 09/15/2013 10:35   Labs: Basic Metabolic Panel:  Recent Labs Lab 09/15/13 1210 09/15/13 1852 09/16/13 0540 09/17/13 0551 09/18/13 0525 09/20/13 0542  NA 142  --  138 142 141 141  K 3.2*  --  3.4* 4.0 4.0 3.5  CL 103  --  100 104 101 100  CO2 24  --  26 32 29 30  GLUCOSE 101*  --  104* 102* 89 88  BUN 17  --  18 15 19 19   CREATININE 0.81  --  1.10 0.90 1.16* 1.07  CALCIUM 8.5  --  8.5 8.9 9.0 8.8  MG  --  1.8  --   --   --   --    Liver Function Tests:  Recent Labs Lab 09/15/13 1210  AST 20  ALT 12  ALKPHOS 127*  BILITOT 0.4  PROT 6.4  ALBUMIN 3.1*   CBC:  Recent Labs Lab 09/15/13 1210 09/18/13 0525 09/20/13 0542  WBC 9.5 8.1 7.5  NEUTROABS 8.7*  --   --   HGB 11.5* 11.9* 12.6  HCT 35.2* 38.4 40.2  MCV 88.2 92.8 91.2  PLT 227 253 328   Cardiac Enzymes:  Recent Labs Lab 09/15/13 1852 09/15/13 2218 09/16/13 0540  TROPONINI <0.30 <0.30 <0.30   BNP: BNP (last 3 results)  Recent Labs  09/15/13 1210 09/21/13 0639  PROBNP 12210.0* 732.9*    Signed:  Charisa Twitty  [facility] 09/20/2013, 4:42 PM

## 2013-09-20 NOTE — Progress Notes (Signed)
Read, reviewed, and agree.  Payslie Mccaig B. Akane Tessier, PT, DPT #[phone]  

## 2013-09-20 NOTE — Progress Notes (Signed)
Pt. Continues to c/o pain under L breast 6/10. Still states it feels like she pulled a muscle. PRN pain medication administered. RN will continue to monitor pt. For changes in condition. Khyan Oats, Cheryll Dessert

## 2013-09-20 NOTE — Clinical Social Work Placement (Addendum)
    Clinical Social Work Department CLINICAL SOCIAL WORK PLACEMENT NOTE 09/30/2013  Patient:  Alicia Aguilar, Alicia Aguilar  Account Number:  [id number] Admit date:  09/15/2013  Clinical Social Worker:  Lupita Leash Ramsay Bognar, LCSWA  Date/time:  09/18/2013 05:12 PM  Clinical Social Work is seeking post-discharge placement for this patient at the following level of care:   SKILLED NURSING   (*CSW will update this form in Epic as items are completed)   09/18/2013  Patient/family provided with [facility] Department of Clinical Social Work's list of facilities offering this level of care within the geographic area requested by the patient (or if unable, by the patient's family).  09/18/2013  Patient/family informed of their freedom to choose among providers that offer the needed level of care, that participate in Medicare, Medicaid or managed care program needed by the patient, have an available bed and are willing to accept the patient.  09/18/2013  Patient/family informed of [facility]' ownership interest in [facility], as well as of the fact that they are under no obligation to receive care at this facility.  PASARR submitted to EDS on 09/19/2013 PASARR number received from EDS on 09/19/2013  FL2 transmitted to all facilities in geographic area requested by pt/family on  09/18/2013 FL2 transmitted to all facilities within larger geographic area on 09/19/2013  Patient informed that his/her managed care company has contracts with or will negotiate with  certain facilities, including the following:   NA     Patient/family informed of bed offers received:  09/20/2013 Patient chooses bed at OTHER Physician recommends and patient chooses bed at    Patient to be transferred to OTHER on   Patient to be transferred to facility by Car with daughterDondra Aguilar  The following physician request were entered in Epic:   Additional Comments: 09/20/13  Per MD- anticipate d/c to SNF 09/21/13.  Family has  chosen [facility] which is over 3 1/2 hours from [place]. Daughter to transport via car. Alicia Aguilar. Alicia Aguilar, LCSWA  [phone]

## 2013-09-20 NOTE — Progress Notes (Signed)
Pts. R groin assessed with off going RN. R groin Level 0. Pt. Denies pain. No distress or discomfort noted. Pt. Resting comfortably in bed. RN will continue to monitor pt. Gurtha Picker, Cheryll Dessert

## 2013-09-21 LAB — PRO B NATRIURETIC PEPTIDE: Pro B Natriuretic peptide (BNP): 732.9 pg/mL — ABNORMAL HIGH (ref 0–125)

## 2013-09-21 NOTE — Progress Notes (Signed)
Patient seen and examined. No CP, no SOB. BNP down to 730. Plan is to discharge to SNF in [place] and follow up with cardiology as an outpatient for further evaluation and treatment. No changes to discharge summary done on 09/20/13. Ok to discharge from internal medicine stand point.  Alicia Aguilar [phone]

## 2013-09-21 NOTE — Progress Notes (Signed)
This CSW has compiled discharge packet and placed this in pt's room. CSW notified pt that packet for facility, and copy of discharge summary & after visit summary are also in a separate packet for pt's daughter, at daughter's request. Pt discharging to [facility] today via car. CSW signing off.   Maryclare Labrador, MSW, [facility] Clinical Social Worker [phone]

## 2013-09-21 NOTE — Progress Notes (Signed)
Pt d/c to SNF escorted by her daughter.  Transported off floor via w/c by volunteer service.  RX from Dr. Gwenlyn Perking given to pt  Placed  In brown envelope and pt made aware.  All other d/c information  given to pt by SW covering for Lupita Leash in brown envelope.  Amanda Pea, Charity fundraiser.

## 2013-09-27 ENCOUNTER — Ambulatory Visit: Payer: Self-pay | Admitting: Family Medicine

## 2013-10-18 DEATH — deceased

## 2014-09-26 ENCOUNTER — Encounter (HOSPITAL_COMMUNITY): Payer: Self-pay | Admitting: Cardiology
